# Patient Record
Sex: Female | Born: 1995 | Race: White | Hispanic: No | Marital: Single | Smoking: Never smoker
Health system: Southern US, Community
[De-identification: ages and names within clinical notes are randomized; demographics above are authoritative.]

## PROBLEM LIST (undated history)

## (undated) DIAGNOSIS — Z789 Other specified health status: Secondary | ICD-10-CM

## (undated) HISTORY — PX: NO PAST SURGERIES: SHX2092

---

## 2019-06-15 NOTE — L&D Delivery Note (Signed)
Delivery Note Labor onset: 04/08/2020  Labor Onset Time: 2240 Complete dilation at 5:02 AM  Onset of pushing at 0503 FHR second stage Cat 1 Analgesia/Anesthesia intrapartum: Epidural  Guided pushing with maternal urge. Delivery of a viable female at 21. Fetal head delivered in direct OA position. Nuchal cord x1, loose delivered via somersault manuever. Infant placed on maternal abd, dried, and tactile stim.  Cord double clamped after pulsation stopped and cut by Tina Davila. Tina Davila, FOB and FOB's mother present for birth. Cord blood sample collected yes Arterial cord blood sample N/A.  Placenta delivered Tina Davila, intact, with 3 VC.  Placenta to L&D. Uterine tone firm bleeding scant  1st degree laceration identified.  Anesthesia: Epidural Repair: 2-0 Vicryl CT EBL (mL): 100 Complications: none APGAR: APGAR (1 MIN): 8   APGAR (5 MINS): 9   APGAR (10 MINS):   Mom to postpartum.  Baby to Couplet care / Skin to Skin   Tina Davila Parents request in-pt circ  Roma Schanz MSN, CNM 04/09/2020, 6:15 AM

## 2019-10-24 ENCOUNTER — Other Ambulatory Visit: Payer: Self-pay | Admitting: Obstetrics and Gynecology

## 2019-10-24 DIAGNOSIS — Z363 Encounter for antenatal screening for malformations: Secondary | ICD-10-CM

## 2019-10-24 DIAGNOSIS — Z3A19 19 weeks gestation of pregnancy: Secondary | ICD-10-CM

## 2019-11-13 ENCOUNTER — Encounter: Payer: Self-pay | Admitting: *Deleted

## 2019-11-14 ENCOUNTER — Other Ambulatory Visit: Payer: Self-pay

## 2019-11-14 ENCOUNTER — Ambulatory Visit: Payer: Medicaid Other | Attending: Obstetrics and Gynecology

## 2019-11-14 DIAGNOSIS — Z363 Encounter for antenatal screening for malformations: Secondary | ICD-10-CM | POA: Diagnosis not present

## 2019-11-14 DIAGNOSIS — Z3A19 19 weeks gestation of pregnancy: Secondary | ICD-10-CM | POA: Diagnosis present

## 2019-11-14 DIAGNOSIS — O0932 Supervision of pregnancy with insufficient antenatal care, second trimester: Secondary | ICD-10-CM

## 2020-03-15 LAB — OB RESULTS CONSOLE GBS: GBS: NEGATIVE

## 2020-04-09 ENCOUNTER — Inpatient Hospital Stay (HOSPITAL_COMMUNITY): Payer: Medicaid Other | Admitting: Anesthesiology

## 2020-04-09 ENCOUNTER — Inpatient Hospital Stay (HOSPITAL_COMMUNITY)
Admission: AD | Admit: 2020-04-09 | Discharge: 2020-04-10 | DRG: 807 | Disposition: A | Discharge status: Home / Self Care | Payer: Medicaid Other | Attending: Obstetrics & Gynecology | Admitting: Obstetrics & Gynecology

## 2020-04-09 ENCOUNTER — Encounter (HOSPITAL_COMMUNITY): Payer: Self-pay | Admitting: Obstetrics & Gynecology

## 2020-04-09 ENCOUNTER — Other Ambulatory Visit: Payer: Self-pay

## 2020-04-09 DIAGNOSIS — Z20822 Contact with and (suspected) exposure to covid-19: Secondary | ICD-10-CM | POA: Diagnosis present

## 2020-04-09 DIAGNOSIS — Z283 Underimmunization status: Secondary | ICD-10-CM

## 2020-04-09 DIAGNOSIS — Z3A40 40 weeks gestation of pregnancy: Secondary | ICD-10-CM

## 2020-04-09 DIAGNOSIS — O09899 Supervision of other high risk pregnancies, unspecified trimester: Secondary | ICD-10-CM

## 2020-04-09 DIAGNOSIS — O99891 Other specified diseases and conditions complicating pregnancy: Secondary | ICD-10-CM

## 2020-04-09 DIAGNOSIS — F419 Anxiety disorder, unspecified: Secondary | ICD-10-CM | POA: Diagnosis not present

## 2020-04-09 DIAGNOSIS — O26893 Other specified pregnancy related conditions, third trimester: Secondary | ICD-10-CM | POA: Diagnosis present

## 2020-04-09 HISTORY — DX: Other specified health status: Z78.9

## 2020-04-09 LAB — TYPE AND SCREEN
ABO/RH(D): B POS
Antibody Screen: NEGATIVE

## 2020-04-09 LAB — RESPIRATORY PANEL BY RT PCR (FLU A&B, COVID)
Influenza A by PCR: NEGATIVE
Influenza B by PCR: NEGATIVE
SARS Coronavirus 2 by RT PCR: NEGATIVE

## 2020-04-09 LAB — CBC
HCT: 40.3 % (ref 36.0–46.0)
Hemoglobin: 12.9 g/dL (ref 12.0–15.0)
MCH: 25.5 pg — ABNORMAL LOW (ref 26.0–34.0)
MCHC: 32 g/dL (ref 30.0–36.0)
MCV: 79.6 fL — ABNORMAL LOW (ref 80.0–100.0)
Platelets: 315 10*3/uL (ref 150–400)
RBC: 5.06 MIL/uL (ref 3.87–5.11)
RDW: 18.6 % — ABNORMAL HIGH (ref 11.5–15.5)
WBC: 14.5 10*3/uL — ABNORMAL HIGH (ref 4.0–10.5)
nRBC: 0 % (ref 0.0–0.2)

## 2020-04-09 LAB — RPR: RPR Ser Ql: NONREACTIVE

## 2020-04-09 MED ORDER — LACTATED RINGERS IV SOLN
500.0000 mL | Freq: Once | INTRAVENOUS | Status: AC
  Administered 2020-04-09: 500 mL via INTRAVENOUS

## 2020-04-09 MED ORDER — ONDANSETRON HCL 4 MG/2ML IJ SOLN: 4.0000 mg | Freq: Four times a day (QID) | INTRAMUSCULAR | Status: DC | PRN

## 2020-04-09 MED ORDER — OXYTOCIN-SODIUM CHLORIDE 30-0.9 UT/500ML-% IV SOLN
2.5000 [IU]/h | INTRAVENOUS | Status: DC
  Filled 2020-04-09: qty 500

## 2020-04-09 MED ORDER — SODIUM CHLORIDE (PF) 0.9 % IJ SOLN
INTRAMUSCULAR | Status: DC | PRN
  Administered 2020-04-09: 12 mL/h via EPIDURAL

## 2020-04-09 MED ORDER — WITCH HAZEL-GLYCERIN EX PADS: 1.0000 "application " | MEDICATED_PAD | CUTANEOUS | Status: DC | PRN

## 2020-04-09 MED ORDER — EPHEDRINE 5 MG/ML INJ: 10.0000 mg | INTRAVENOUS | Status: DC | PRN

## 2020-04-09 MED ORDER — LIDOCAINE HCL (PF) 1 % IJ SOLN
INTRAMUSCULAR | Status: DC | PRN
  Administered 2020-04-09: 6 mL via EPIDURAL

## 2020-04-09 MED ORDER — SOD CITRATE-CITRIC ACID 500-334 MG/5ML PO SOLN: 30.0000 mL | ORAL | Status: DC | PRN

## 2020-04-09 MED ORDER — OXYTOCIN BOLUS FROM INFUSION
333.0000 mL | Freq: Once | INTRAVENOUS | Status: AC
  Administered 2020-04-09: 333 mL via INTRAVENOUS

## 2020-04-09 MED ORDER — LACTATED RINGERS IV SOLN: INTRAVENOUS | Status: DC

## 2020-04-09 MED ORDER — LACTATED RINGERS IV SOLN: 500.0000 mL | INTRAVENOUS | Status: DC | PRN

## 2020-04-09 MED ORDER — DIPHENHYDRAMINE HCL 50 MG/ML IJ SOLN: 12.5000 mg | INTRAMUSCULAR | Status: DC | PRN

## 2020-04-09 MED ORDER — BENZOCAINE-MENTHOL 20-0.5 % EX AERO
1.0000 "application " | INHALATION_SPRAY | CUTANEOUS | Status: DC | PRN
  Administered 2020-04-09: 1 via TOPICAL
  Filled 2020-04-09: qty 56

## 2020-04-09 MED ORDER — ONDANSETRON HCL 4 MG/2ML IJ SOLN: 4.0000 mg | INTRAMUSCULAR | Status: DC | PRN

## 2020-04-09 MED ORDER — LIDOCAINE HCL (PF) 1 % IJ SOLN: 30.0000 mL | INTRAMUSCULAR | Status: DC | PRN

## 2020-04-09 MED ORDER — PHENYLEPHRINE 40 MCG/ML (10ML) SYRINGE FOR IV PUSH (FOR BLOOD PRESSURE SUPPORT): 80.0000 ug | PREFILLED_SYRINGE | INTRAVENOUS | Status: DC | PRN

## 2020-04-09 MED ORDER — DIPHENHYDRAMINE HCL 25 MG PO CAPS: 25.0000 mg | ORAL_CAPSULE | Freq: Four times a day (QID) | ORAL | Status: DC | PRN

## 2020-04-09 MED ORDER — ONDANSETRON HCL 4 MG PO TABS: 4.0000 mg | ORAL_TABLET | ORAL | Status: DC | PRN

## 2020-04-09 MED ORDER — DIBUCAINE (PERIANAL) 1 % EX OINT: 1.0000 "application " | TOPICAL_OINTMENT | CUTANEOUS | Status: DC | PRN

## 2020-04-09 MED ORDER — PRENATAL MULTIVITAMIN CH
1.0000 | ORAL_TABLET | Freq: Every day | ORAL | Status: DC
  Administered 2020-04-09 – 2020-04-10 (×2): 1 via ORAL
  Filled 2020-04-09 (×2): qty 1

## 2020-04-09 MED ORDER — SENNOSIDES-DOCUSATE SODIUM 8.6-50 MG PO TABS
2.0000 | ORAL_TABLET | ORAL | Status: DC
  Administered 2020-04-09: 2 via ORAL
  Filled 2020-04-09: qty 2

## 2020-04-09 MED ORDER — FENTANYL-BUPIVACAINE-NACL 0.5-0.125-0.9 MG/250ML-% EP SOLN: 12.0000 mL/h | EPIDURAL | Status: DC | PRN

## 2020-04-09 MED ORDER — TETANUS-DIPHTH-ACELL PERTUSSIS 5-2.5-18.5 LF-MCG/0.5 IM SUSY: 0.5000 mL | PREFILLED_SYRINGE | Freq: Once | INTRAMUSCULAR | Status: DC

## 2020-04-09 MED ORDER — FLEET ENEMA 7-19 GM/118ML RE ENEM: 1.0000 | ENEMA | RECTAL | Status: DC | PRN

## 2020-04-09 MED ORDER — ACETAMINOPHEN 325 MG PO TABS: 650.0000 mg | ORAL_TABLET | ORAL | Status: DC | PRN

## 2020-04-09 MED ORDER — COCONUT OIL OIL
1.0000 "application " | TOPICAL_OIL | Status: DC | PRN
  Administered 2020-04-10: 1 via TOPICAL

## 2020-04-09 MED ORDER — OXYCODONE-ACETAMINOPHEN 5-325 MG PO TABS: 2.0000 | ORAL_TABLET | ORAL | Status: DC | PRN

## 2020-04-09 MED ORDER — IBUPROFEN 600 MG PO TABS
600.0000 mg | ORAL_TABLET | Freq: Four times a day (QID) | ORAL | Status: DC
  Administered 2020-04-09 – 2020-04-10 (×4): 600 mg via ORAL
  Filled 2020-04-09 (×4): qty 1

## 2020-04-09 MED ORDER — OXYCODONE-ACETAMINOPHEN 5-325 MG PO TABS: 1.0000 | ORAL_TABLET | ORAL | Status: DC | PRN

## 2020-04-09 MED ORDER — FENTANYL-BUPIVACAINE-NACL 0.5-0.125-0.9 MG/250ML-% EP SOLN
EPIDURAL | Status: AC
  Filled 2020-04-09: qty 250

## 2020-04-09 MED ORDER — SIMETHICONE 80 MG PO CHEW: 80.0000 mg | CHEWABLE_TABLET | ORAL | Status: DC | PRN

## 2020-04-09 NOTE — Anesthesia Preprocedure Evaluation (Signed)
Anesthesia Evaluation  Patient identified by MRN, date of birth, ID band Patient awake    Reviewed: Allergy & Precautions, H&P , NPO status , Patient's Chart, lab work & pertinent test results, reviewed documented beta blocker date and time   Airway Mallampati: I  TM Distance: >3 FB Neck ROM: full    Dental no notable dental hx. (+) Teeth Intact, Dental Advisory Given   Pulmonary neg pulmonary ROS,    Pulmonary exam normal breath sounds clear to auscultation       Cardiovascular negative cardio ROS Normal cardiovascular exam Rhythm:regular Rate:Normal     Neuro/Psych PSYCHIATRIC DISORDERS Anxiety negative neurological ROS     GI/Hepatic negative GI ROS, Neg liver ROS,   Endo/Other  negative endocrine ROS  Renal/GU negative Renal ROS  negative genitourinary   Musculoskeletal   Abdominal   Peds  Hematology negative hematology ROS (+)   Anesthesia Other Findings   Reproductive/Obstetrics (+) Pregnancy                             Anesthesia Physical Anesthesia Plan  ASA: II  Anesthesia Plan: Epidural   Post-op Pain Management:    Induction:   PONV Risk Score and Plan:   Airway Management Planned: Natural Airway  Additional Equipment:   Intra-op Plan:   Post-operative Plan:   Informed Consent: I have reviewed the patients History and Physical, chart, labs and discussed the procedure including the risks, benefits and alternatives for the proposed anesthesia with the patient or authorized representative who has indicated his/her understanding and acceptance.       Plan Discussed with: Anesthesiologist  Anesthesia Plan Comments:         Anesthesia Quick Evaluation

## 2020-04-09 NOTE — Anesthesia Postprocedure Evaluation (Signed)
Anesthesia Post Note  Patient: Diplomatic Services operational officer  Procedure(s) Performed: AN AD HOC LABOR EPIDURAL     Patient location during evaluation: Mother Baby Anesthesia Type: Epidural Level of consciousness: awake, awake and alert and oriented Pain management: pain level controlled Vital Signs Assessment: post-procedure vital signs reviewed and stable Respiratory status: spontaneous breathing and respiratory function stable Cardiovascular status: blood pressure returned to baseline Postop Assessment: no headache, epidural receding, patient able to bend at knees, adequate PO intake, no backache, no apparent nausea or vomiting and able to ambulate Anesthetic complications: no   No complications documented.  Last Vitals:  Vitals:   04/09/20 0917 04/09/20 1145  BP: 114/75 108/67  Pulse: 71 80  Resp: 17 18  Temp: 37.1 C 36.9 C  SpO2: 97% 100%    Last Pain:  Vitals:   04/09/20 1312  TempSrc:   PainSc: 4    Pain Goal: Patients Stated Pain Goal: 3 (04/09/20 0807)                 Cleda Clarks

## 2020-04-09 NOTE — Anesthesia Procedure Notes (Signed)
Epidural Patient location during procedure: OB Start time: 04/09/2020 3:05 AM End time: 04/09/2020 6:10 AM  Staffing Anesthesiologist: Bethena Midget, MD  Preanesthetic Checklist Completed: patient identified, IV checked, site marked, risks and benefits discussed, surgical consent, monitors and equipment checked, pre-op evaluation and timeout performed  Epidural Patient position: sitting Prep: DuraPrep and site prepped and draped Patient monitoring: continuous pulse ox and blood pressure Approach: midline Location: L3-L4 Injection technique: LOR air  Needle:  Needle type: Tuohy  Needle gauge: 17 G Needle length: 9 cm and 9 Needle insertion depth: 4 cm Catheter type: closed end flexible Catheter size: 19 Gauge Catheter at skin depth: 9 cm Test dose: negative  Assessment Events: blood not aspirated, injection not painful, no injection resistance, no paresthesia and negative IV test

## 2020-04-09 NOTE — H&P (Signed)
OB ADMISSION/ HISTORY & PHYSICAL:  Admission Date: 04/09/2020 12:55 AM  Admit Diagnosis: Normal  Tina Davila is a 24 y.o. female G1P0 [redacted]w[redacted]d presenting for ctx. Endorses active FM, denies LOF. Ctx began in late afternoon and increased in intensity. Scant bloody show w/ wiping.   History of current pregnancy: G1P0   Patient entered care with [facility] at 16+1 wks.   EDC 04/09/20 by LMP and congruent w/ 16+1 wk U/S.   Anatomy scan:  22 wks, complete w/ anterior placenta.   Antenatal testing: None Last evaluation: 33 wks AFI=10.3 EFW=2138g 4 # 11oz, 42%  Significant prenatal events:  Patient Active Problem List   Diagnosis Date Noted  . Normal labor 04/09/2020  . Rubella non-immune status, antepartum 04/09/2020  . Anxiety 04/09/2020    Prenatal Labs: ABO, Rh: --/--/B POS (10/27 0210) Antibody: NEG (10/27 0210) Rubella:   non-immune RPR:   NR HBsAg:   NR HIV:   NR GTT: passed 1 hr GBS: Negative/-- (10/02 0000)  GC/CHL: neg/neg Genetics: low-risk female, Horizon and AFP neg Tdap/influenza vaccines: rec'vd both prenatally   OB History  Gravida Para Term Preterm AB Living  1            SAB TAB Ectopic Multiple Live Births               # Outcome Date GA Lbr Len/2nd Weight Sex Delivery Anes PTL Lv  1 Current             Medical / Surgical History: Past medical history:  Past Medical History:  Diagnosis Date  . Medical history non-contributory     Past surgical history:  Past Surgical History:  Procedure Laterality Date  . NO PAST SURGERIES     Family History: History reviewed. No pertinent family history.  Social History:  reports that she has never smoked. She has never used smokeless tobacco. She reports that she does not drink alcohol and does not use drugs.  Allergies: Patient has no known allergies.   Current Medications at time of admission:  Prior to Admission medications   Medication Sig Start Date End Date Taking? Authorizing Provider  Prenatal Vit-Fe  Fumarate-FA (PRENATAL MULTIVITAMIN) TABS tablet Take 1 tablet by mouth daily at 12 noon.   Yes [provider]    Review of Systems: Constitutional: Negative   HENT: Negative   Eyes: Negative   Respiratory: Negative   Cardiovascular: Negative   Gastrointestinal: Negative  Genitourinary: pos for bloody show, neg for LOF   Musculoskeletal: Negative   Skin: Negative   Neurological: Negative   Endo/Heme/Allergies: Negative   Psychiatric/Behavioral: Negative    Physical Exam: VS: Blood pressure 138/90, pulse (!) 104, temperature 98 F (36.7 C), temperature source Oral, resp. rate 18, height 4\' 10"  (1.473 m), weight 57.6 kg, last menstrual period 07/04/2019, SpO2 99 %. AAO x3, no signs of distress Cardiovascular: RRR Respiratory: Lung fields clear to ausculation GU/GI: Abdomen gravid, non-tender, non-distended, active FM, vertex, EFW 6# 5oz per Leopold's Extremities: no edema, negative for pain, tenderness, and cords  Cervical exam:Dilation: 7.5 Effacement (%): 90 Station: 0 Exam by:: [email or web address] RN FHR: baseline rate 155 / variability moderate / accelerations present / absent decelerations TOCO: 3-5   Prenatal Transfer Tool  Maternal Diabetes: No Genetic Screening: Normal Maternal Ultrasounds/Referrals: Normal Fetal Ultrasounds or other Referrals:  None Maternal Substance Abuse:  No Significant Maternal Medications:  None Significant Maternal Lab Results: Group B Strep negative    Assessment: 24 y.o. G1P0 [redacted]w[redacted]d  Active stage of labor FHR category 1 GBS GBS neg Pain management plan: epidural   Plan:  Admit to L&D Routine admission orders Epidural PRN  Dr Mora Appl notified of admission and plan of care  Roma Schanz MSN, CNM 04/09/2020 3:00 AM

## 2020-04-09 NOTE — Progress Notes (Signed)
Subjective:    Comfortable w/ epidural. Agrees to AROM.   Objective:    VS: BP 109/68   Pulse (!) 105   Temp 98 F (36.7 C) (Oral)   Resp 20   Ht 4\' 10"  (1.473 m)   Wt 57.6 kg   LMP 07/04/2019   SpO2 99%   BMI 26.54 kg/m  FHR : baseline 155 / variability moderate / accelerations present / absent decelerations Toco: contractions every 2 minutes  Membranes: AROM, clear Dilation: 8 Effacement (%): 90 Cervical Position: Middle Station: 0 Presentation: Vertex Exam by:: [email or web address], CNM    Assessment/Plan:   24 y.o. G1P0 [redacted]w[redacted]d  Labor: Progressing normally Preeclampsia:  N/A Fetal Wellbeing:  Category I Pain Control:  Epidural I/D:  n/a Anticipated MOD:  NSVD  [redacted]w[redacted]d MSN, CNM 04/09/2020 3:47 AM

## 2020-04-09 NOTE — Lactation Note (Signed)
This note was copied from a baby's chart. Lactation Consultation Note  Patient Name: Tina Davila OMVEH'M Date: 04/09/2020 Reason for consult: Initial assessment;Term;Primapara;1st time breastfeeding   P1 mother whose infant is now 73 hours old.  This is a term baby at 40+0 weeks.  Mother is on the first floor due to no availability of 4th or 5th floors after delivery.  She and baby have no medical concerns.  Baby was swaddled and asleep in grandmother's arms when I arrived.  Mother had breast fed an hour prior to my arrival.  Reviewed feeding cues and hand expression with mother.  She was able to express colostrum drops from both breasts.  Container provided and milk storage times reviewed.  Finger feeding demonstrated and baby received colostrum drops.  Mother will continue to feed on cue or at least 8-12 times/24 hours.  She will practice hand expression and feed back any EBM she obtains to baby.  Suggested mother call her RN/LC for latch assistance as needed.  Advised to use EBM to rub into nipples/areolas for comfort.  Mother has a DEBP for home use.  Father and grandmother supportive.   Maternal Data Formula Feeding for Exclusion: No Has patient been taught Hand Expression?: Yes Does the patient have breastfeeding experience prior to this delivery?: No  Feeding Feeding Type: Breast Fed  LATCH Score Latch: Grasps breast easily, tongue down, lips flanged, rhythmical sucking.  Audible Swallowing: A few with stimulation  Type of Nipple: Everted at rest and after stimulation  Comfort (Breast/Nipple): Soft / non-tender  Hold (Positioning): Assistance needed to correctly position infant at breast and maintain latch.  LATCH Score: 8  Interventions    Lactation Tools Discussed/Used     Consult Status Consult Status: Follow-up Date: 04/10/20 Follow-up type: In-patient    Paiton Boultinghouse R Jantzen Pilger 04/09/2020, 10:21 AM

## 2020-04-09 NOTE — MAU Note (Signed)
Pt reports stronger, some bloody discharge. SVE 3 in office today. Reports good fetal movement

## 2020-04-10 LAB — CBC
HCT: 35.8 % — ABNORMAL LOW (ref 36.0–46.0)
Hemoglobin: 11.2 g/dL — ABNORMAL LOW (ref 12.0–15.0)
MCH: 25.5 pg — ABNORMAL LOW (ref 26.0–34.0)
MCHC: 31.3 g/dL (ref 30.0–36.0)
MCV: 81.4 fL (ref 80.0–100.0)
Platelets: 245 10*3/uL (ref 150–400)
RBC: 4.4 MIL/uL (ref 3.87–5.11)
RDW: 19.1 % — ABNORMAL HIGH (ref 11.5–15.5)
WBC: 14.6 10*3/uL — ABNORMAL HIGH (ref 4.0–10.5)
nRBC: 0 % (ref 0.0–0.2)

## 2020-04-10 MED ORDER — IBUPROFEN 600 MG PO TABS
600.0000 mg | ORAL_TABLET | Freq: Four times a day (QID) | ORAL | 0 refills | Status: DC
Start: 2020-04-10 — End: 2021-05-20

## 2020-04-10 NOTE — Discharge Summary (Signed)
SVD OB Discharge Summary     Patient Name: Tina Davila DOB: [birth year] MRN: [id number]  Date of admission: 04/09/2020 Delivering MD: Rhea Pink B  Date of delivery: 04/09/2020 Type of delivery: SVD  Newborn Data: Sex: Baby female Circumcision: DR Normand Sloop performed today.  Live born female  Birth Weight: 7 lb 2.5 oz (3246 g) APGAR: 8, 9  Newborn Delivery   Birth date/time: 04/09/2020 05:44:00 Delivery type: Vaginal, Spontaneous      Feeding: breast and bottle Infant being discharge to home with mother in stable condition.   Admitting diagnosis: Normal labor [O80, Z37.9] Intrauterine pregnancy: [redacted]w[redacted]d     Secondary diagnosis:  Active Problems:   Normal labor   Rubella non-immune status, antepartum   Anxiety   SVD (spontaneous vaginal delivery)   Normal postpartum course                                Complications: None                                                              Intrapartum Procedures: spontaneous vaginal delivery Postpartum Procedures: none Complications-Operative and Postpartum: 1st degree perineal laceration Augmentation: AROM   History of Present Illness: Ms. Tina Davila is a 24 y.o. female, G1P1001, who presents at [redacted]w[redacted]d weeks gestation. The patient has been followed at  [facility] and Gynecology  Her pregnancy has been complicated by:  Patient Active Problem List   Diagnosis Date Noted  . SVD (spontaneous vaginal delivery) 04/10/2020  . Normal postpartum course 04/10/2020  . Normal labor 04/09/2020  . Rubella non-immune status, antepartum 04/09/2020  . Anxiety 04/09/2020    Hospital course:  Onset of Labor With Vaginal Delivery      24 y.o. yo G1P1001 at [redacted]w[redacted]d was admitted in Active Labor on 04/09/2020. Patient had an uncomplicated labor course as follows:  Membrane Rupture Time/Date: 3:41 AM ,04/09/2020   Delivery Method:Vaginal, Spontaneous  Episiotomy: None  Lacerations:  1st degree  Patient had an  uncomplicated postpartum course.  She is ambulating, tolerating a regular diet, passing flatus, and urinating well. Patient is discharged home in stable condition on 04/10/20.  Newborn Data: Birth date:04/09/2020  Birth time:5:44 AM  Gender:Female  Living status:Living  Apgars:8 ,9  Weight:3246 g  Postpartum Day # 1 : S/P NSVD due to spontaneous active labor, was augmented with AROM, over 1st degree laceration, with ebl of , hgb drop from 12.9-11.2, stable. H/O anxiety, no meds, mood stable, denies si/hi, will report mood change to ccob with 2 week PP mood check, pt reviewed s/sx of PPD and need for medication, pt declined meds for now. Patient up ad lib, denies syncope or dizziness. Reports consuming regular diet without issues and denies N/V. Patient reports 0 bowel movement + passing flatus.  Denies issues with urination and reports bleeding is "lighter."  Patient is breast and bottlefeeding and reports going well.  Desires undecided/comdoms for postpartum contraception.  Pain is being appropriately managed with use of po meds.    Physical exam  Vitals:   04/09/20 1955 04/09/20 2344 04/10/20 0426 04/10/20 0849  BP: (!) 128/95 117/68 109/74 120/75  Pulse: 82 61 80 87  Resp: 18 19 18  18  Temp: 98.2 F (36.8 C) 97.8 F (36.6 C) 98.2 F (36.8 C) (!) 97.5 F (36.4 C)  TempSrc: Oral Oral Oral Oral  SpO2: 99% 100% 100%   Weight:      Height:       General: alert, cooperative and no distress Lochia: appropriate Uterine Fundus: firm Perineum: approximate, no hematomas noted DVT Evaluation: No evidence of DVT seen on physical exam. Negative Homan's sign. No cords or calf tenderness. No significant calf/ankle edema.  Labs: Lab Results  Component Value Date   WBC 14.6 (H) 04/10/2020   HGB 11.2 (L) 04/10/2020   HCT 35.8 (L) 04/10/2020   MCV 81.4 04/10/2020   PLT 245 04/10/2020   No flowsheet data found.  Date of discharge: 04/10/2020 Discharge Diagnoses: Term  Pregnancy-delivered Discharge instruction: per After Visit Summary and "Baby and Me Booklet".  After visit meds:   Activity:           unrestricted and pelvic rest Advance as tolerated. Pelvic rest for 6 weeks.  Diet:                routine Medications: PNV and Ibuprofen Postpartum contraception: Condoms and Undecided Condition:  Pt discharge to home with baby in stable and  condition H/O Anxiety: NO meds, 2 week Mood check up at [facility], report mood changes, SI/HI.   Meds: Allergies as of 04/10/2020   No Known Allergies     Medication List    TAKE these medications   ibuprofen 600 MG tablet Commonly known as: ADVIL Take 1 tablet (600 mg total) by mouth every 6 (six) hours.   prenatal multivitamin Tabs tablet Take 1 tablet by mouth daily at 12 noon.       Discharge Follow Up:   Follow-up Information    [facility]. Schedule an appointment as soon as possible for a visit in 2 week(s).   Specialty: Obstetrics and Gynecology Why:  weeks Mood check and 6 weeks PPV Contact information: [address] [place] [address] [phone]               [place], NP-C, CNM 04/10/2020, 10:07 AM  Tina Windsor, FNP

## 2020-04-10 NOTE — Progress Notes (Signed)
CSW received consult for hx of Anxiety.  CSW met with MOB to offer support and complete assessment.    CSW met with MOB at bedside to discuss consult for history of anxiety. MOB was welcoming, pleasant and remained engaged during assessment. CSW and MOB discussed MOB's mental health history. MOB reported that she was diagnosed with anxiety 5 years ago during high school. MOB reported that her anxiety was situational and denied any current symptoms. MOB denied any additional mental health history. CSW inquired about how MOB was feeling emotionally after giving birth, MOB reported that she was feeling great. CSW inquired about MOB's support system, MOB reported that FOB and her mom are supports. MOB shared that her mother resides in [place] and will be visiting next week. MOB reported that they have all items needed to care for infant including car seat and basinet. MOB presented calm and did not demonstrate any acute mental health signs/symptoms. CSW assessed for safety, MOB denied SI, HI and domestic violence.   CSW provided education regarding the baby blues period vs. perinatal mood disorders, discussed treatment and gave resources for mental health follow up if concerns arise.  CSW recommends self-evaluation during the postpartum time period using the New Mom Checklist from Postpartum Progress and encouraged MOB to contact a medical professional if symptoms are noted at any time.    CSW provided review of Sudden Infant Death Syndrome (SIDS) precautions.    CSW identifies no further need for intervention and no barriers to discharge at this time.  Abundio Miu, [place] Worker [facility] Cell#: [phone]

## 2020-12-04 ENCOUNTER — Other Ambulatory Visit: Payer: Self-pay

## 2020-12-04 ENCOUNTER — Emergency Department (HOSPITAL_BASED_OUTPATIENT_CLINIC_OR_DEPARTMENT_OTHER): Payer: Medicaid Other

## 2020-12-04 ENCOUNTER — Encounter (HOSPITAL_BASED_OUTPATIENT_CLINIC_OR_DEPARTMENT_OTHER): Payer: Self-pay

## 2020-12-04 ENCOUNTER — Emergency Department (HOSPITAL_BASED_OUTPATIENT_CLINIC_OR_DEPARTMENT_OTHER)
Admission: EM | Admit: 2020-12-04 | Discharge: 2020-12-04 | Disposition: A | Discharge status: Home / Self Care | Payer: Medicaid Other | Attending: Emergency Medicine | Admitting: Emergency Medicine

## 2020-12-04 DIAGNOSIS — R0789 Other chest pain: Secondary | ICD-10-CM | POA: Diagnosis not present

## 2020-12-04 MED ORDER — HYOSCYAMINE SULFATE 0.125 MG SL SUBL
0.2500 mg | SUBLINGUAL_TABLET | Freq: Once | SUBLINGUAL | Status: AC
  Administered 2020-12-04: 0.25 mg via SUBLINGUAL
  Filled 2020-12-04: qty 2

## 2020-12-04 MED ORDER — ALUM & MAG HYDROXIDE-SIMETH 200-200-20 MG/5ML PO SUSP
30.0000 mL | Freq: Once | ORAL | Status: AC
  Administered 2020-12-04: 30 mL via ORAL
  Filled 2020-12-04: qty 30

## 2020-12-04 MED ORDER — LIDOCAINE VISCOUS HCL 2 % MT SOLN
15.0000 mL | Freq: Once | OROMUCOSAL | Status: AC
  Administered 2020-12-04: 15 mL via ORAL
  Filled 2020-12-04: qty 15

## 2020-12-04 NOTE — ED Provider Notes (Signed)
[facility] EMERGENCY DEPT Provider Note  CSN: [id number] Arrival date & time: 12/04/20 0023  Chief Complaint(s) Chest Pain  HPI Tina Davila is a 25 y.o. female    Chest Pain Pain location:  Substernal area Pain quality comment:  Squeezing Pain radiates to:  Does not radiate Pain severity:  Moderate Duration:  1 week Timing:  Intermittent Progression:  Waxing and waning Chronicity:  New Relieved by:  Nothing Worsened by:  Nothing Associated symptoms: no back pain, no cough, no lower extremity edema, no nausea and no vomiting    Past Medical History Past Medical History:  Diagnosis Date   Medical history non-contributory    Patient Active Problem List   Diagnosis Date Noted   SVD (spontaneous vaginal delivery) 04/10/2020   Normal postpartum course 04/10/2020   Normal labor 04/09/2020   Rubella non-immune status, antepartum 04/09/2020   Anxiety 04/09/2020   Home Medication(s) Prior to Admission medications   Medication Sig Start Date End Date Taking? Authorizing Provider  ibuprofen (ADVIL) 600 MG tablet Take 1 tablet (600 mg total) by mouth every 6 (six) hours. 04/10/20   Dale [place], FNP  Prenatal Vit-Fe Fumarate-FA (PRENATAL MULTIVITAMIN) TABS tablet Take 1 tablet by mouth daily at 12 noon.    [provider]                                                                                                                                    Past Surgical History Past Surgical History:  Procedure Laterality Date   NO PAST SURGERIES     Family History History reviewed. No pertinent family history.  Social History Social History   Tobacco Use   Smoking status: Never   Smokeless tobacco: Never  Vaping Use   Vaping Use: Some days  Substance Use Topics   Alcohol use: Never   Drug use: Never   Allergies Patient has no known allergies.  Review of Systems Review of Systems  Respiratory:  Negative for cough.   Cardiovascular:   Positive for chest pain.  Gastrointestinal:  Negative for nausea and vomiting.  Musculoskeletal:  Negative for back pain.  All other systems are reviewed and are negative for acute change except as noted in the HPI  Physical Exam Vital Signs  I have reviewed the triage vital signs BP 115/74 (BP Location: Right Arm)   Pulse 88   Temp 98.2 F (36.8 C) (Oral)   Resp 18   Ht 4\' 10"  (1.473 m)   Wt 58.1 kg   LMP 11/13/2020   SpO2 100%   BMI 26.75 kg/m   Physical Exam Vitals reviewed.  Constitutional:      General: She is not in acute distress.    Appearance: She is well-developed. She is not diaphoretic.  HENT:     Head: Normocephalic and atraumatic.     Nose: Nose normal.  Eyes:     General: No scleral icterus.  Right eye: No discharge.        Left eye: No discharge.     Conjunctiva/sclera: Conjunctivae normal.     Pupils: Pupils are equal, round, and reactive to light.  Cardiovascular:     Rate and Rhythm: Normal rate and regular rhythm.     Heart sounds: No murmur heard.   No friction rub. No gallop.  Pulmonary:     Effort: Pulmonary effort is normal. No respiratory distress.     Breath sounds: Normal breath sounds. No stridor. No rales.  Abdominal:     General: There is no distension.     Palpations: Abdomen is soft.     Tenderness: There is no abdominal tenderness.  Musculoskeletal:        General: No tenderness.     Cervical back: Normal range of motion and neck supple.  Skin:    General: Skin is warm and dry.     Findings: No erythema or rash.  Neurological:     Mental Status: She is alert and oriented to person, place, and time.    ED Results and Treatments Labs (all labs ordered are listed, but only abnormal results are displayed) Labs Reviewed - No data to display                                                                                                                       EKG  EKG Interpretation  Date/Time:  Thursday December 04 2020  02:02:58 EDT Ventricular Rate:  76 PR Interval:  129 QRS Duration: 106 QT Interval:  366 QTC Calculation: 412 R Axis:   44 Text Interpretation: Sinus rhythm No old tracing to compare Confirmed by Drema Pry [phone]) on 12/04/2020 2:06:58 AM        Radiology DG Chest Port 1 View  Result Date: 12/04/2020 CLINICAL DATA:  Pt reports intermittent chest pain - states it started few days ago and its more frequent today Pt states possibility of pregnancy. Patient's abdomen shielded during chest xr. EXAM: PORTABLE CHEST 1 VIEW COMPARISON:  None. FINDINGS: The heart size and mediastinal contours are within normal limits. No focal consolidation. No pulmonary edema. No pleural effusion. No pneumothorax. No acute osseous abnormality. IMPRESSION: No active disease. Electronically Signed   By: Tish Frederickson M.D.   On: 12/04/2020 03:08    Pertinent labs & imaging results that were available during my care of the patient were reviewed by me and considered in my medical decision making (see chart for details).  Medications Ordered in ED Medications  alum & mag hydroxide-simeth (MAALOX/MYLANTA) 200-200-20 MG/5ML suspension 30 mL (30 mLs Oral Given 12/04/20 0228)    And  lidocaine (XYLOCAINE) 2 % viscous mouth solution 15 mL (15 mLs Oral Given 12/04/20 0228)  hyoscyamine (LEVSIN SL) SL tablet 0.25 mg (0.25 mg Sublingual Given 12/04/20 0227)  Procedures Procedures  (including critical care time)  Medical Decision Making / ED Course I have reviewed the nursing notes for this encounter and the patient's prior records (if available in EHR or on provided paperwork).   Tina Davila was evaluated in Emergency Department on 12/04/2020 for the symptoms described in the history of present illness. She was evaluated in the context of the global COVID-19 pandemic, which  necessitated consideration that the patient might be at risk for infection with the SARS-CoV-2 virus that causes COVID-19. Institutional protocols and algorithms that pertain to the evaluation of patients at risk for COVID-19 are in a state of rapid change based on information released by regulatory bodies including the CDC and federal and state organizations. These policies and algorithms were followed during the patient's care in the ED.  Intermittent chest squeezing. Highly consistent with ACS.  EKG without acute ischemic changes, dysrhythmias or evidence of pericarditis. Do not believe that additional cardiac work-up is necessary at this time. Presentation is not classic for aortic dissection or esophageal perforation.. Low suspicion for pulmonary embolism. Chest x-ray without evidence suggestive of pneumonia, pneumothorax, pneumomediastinum.  No abnormal contour of the mediastinum to suggest dissection. No evidence of acute injuries.  Given patient's description of symptoms, I feel a GI etiology is more likely.      Final Clinical Impression(s) / ED Diagnoses Final diagnoses:  Chest discomfort   The patient appears reasonably screened and/or stabilized for discharge and I doubt any other medical condition or other [facility] requiring further screening, evaluation, or treatment in the ED at this time prior to discharge. Safe for discharge with strict return precautions.  Disposition: Discharge  Condition: Good  I have discussed the results, Dx and Tx plan with the patient/family who expressed understanding and agree(s) with the plan. Discharge instructions discussed at length. The patient/family was given strict return precautions who verbalized understanding of the instructions. No further questions at time of discharge.    ED Discharge Orders     None        Follow Up: Primary care provider  Call  to schedule an appointment for close follow up      This chart was dictated  using voice recognition software.  Despite best efforts to proofread,  errors can occur which can change the documentation meaning.    Nira Conn, MD 12/04/20 [phone]

## 2020-12-04 NOTE — ED Triage Notes (Signed)
Pt reports intermittent  chest pain - states it started few days ago  and its more frequent today

## 2021-01-19 LAB — OB RESULTS CONSOLE RUBELLA ANTIBODY, IGM: Rubella: NON-IMMUNE/NOT IMMUNE

## 2021-01-29 LAB — OB RESULTS CONSOLE HEPATITIS B SURFACE ANTIGEN: Hepatitis B Surface Ag: NEGATIVE

## 2021-01-29 LAB — OB RESULTS CONSOLE HIV ANTIBODY (ROUTINE TESTING): HIV: NONREACTIVE

## 2021-02-03 LAB — OB RESULTS CONSOLE GC/CHLAMYDIA
Chlamydia: NEGATIVE
Gonorrhea: NEGATIVE

## 2021-02-27 LAB — OB RESULTS CONSOLE RPR: RPR: NONREACTIVE

## 2021-05-19 ENCOUNTER — Inpatient Hospital Stay (HOSPITAL_COMMUNITY)
Admission: AD | Admit: 2021-05-19 | Discharge: 2021-05-20 | Disposition: A | Discharge status: Home / Self Care | Payer: Medicaid Other | Attending: Obstetrics & Gynecology | Admitting: Obstetrics & Gynecology

## 2021-05-19 ENCOUNTER — Other Ambulatory Visit: Payer: Self-pay

## 2021-05-19 ENCOUNTER — Encounter (HOSPITAL_COMMUNITY): Payer: Self-pay | Admitting: Obstetrics & Gynecology

## 2021-05-19 DIAGNOSIS — Z3A26 26 weeks gestation of pregnancy: Secondary | ICD-10-CM | POA: Insufficient documentation

## 2021-05-19 DIAGNOSIS — W109XXA Fall (on) (from) unspecified stairs and steps, initial encounter: Secondary | ICD-10-CM | POA: Insufficient documentation

## 2021-05-19 DIAGNOSIS — Z3482 Encounter for supervision of other normal pregnancy, second trimester: Secondary | ICD-10-CM | POA: Insufficient documentation

## 2021-05-19 DIAGNOSIS — W19XXXA Unspecified fall, initial encounter: Secondary | ICD-10-CM

## 2021-05-19 NOTE — Progress Notes (Signed)
Baby moving when EFM applied. Pt states baby has been very active

## 2021-05-19 NOTE — MAU Note (Signed)
At 0930 this am I fell down some stairs. I was taking my dog out and slipped and bounced down 5 stairs more on my right side of my R leg. Never hit my stomach. Good FM today and no LOF or VB. No pain. Was just advised by office to come in to have baby checked

## 2021-05-19 NOTE — MAU Provider Note (Signed)
History     CSN: [id number]  Arrival date and time: 05/19/21 2128   Event Date/Time   First Provider Initiated Contact with Patient 05/19/21 2302      Chief Complaint  Patient presents with   Fall   HPI Tina Davila is a 25 y.o. G2P1001 at [redacted]w[redacted]d who presents to MAU for a fall. Patient reports she tripped and fell down some steps around 0930 this morning. She landed on her right hip/leg. She did not hit her abdomen or sustain trauma to her head. She denies pain, contractions, vaginal bleeding or leaking fluid. Endorses active fetal movement.   OB History     Gravida  2   Para  1   Term  1   Preterm      AB      Living  1      SAB      IAB      Ectopic      Multiple  0   Live Births  1           Past Medical History:  Diagnosis Date   Medical history non-contributory     Past Surgical History:  Procedure Laterality Date   NO PAST SURGERIES      History reviewed. No pertinent family history.  Social History   Tobacco Use   Smoking status: Never   Smokeless tobacco: Never  Vaping Use   Vaping Use: Some days  Substance Use Topics   Alcohol use: Never   Drug use: Never    Allergies: No Known Allergies  Medications Prior to Admission  Medication Sig Dispense Refill Last Dose   Prenatal Vit-Fe Fumarate-FA (PRENATAL MULTIVITAMIN) TABS tablet Take 1 tablet by mouth daily at 12 noon.   05/19/2021   ibuprofen (ADVIL) 600 MG tablet Take 1 tablet (600 mg total) by mouth every 6 (six) hours. 30 tablet 0     Review of Systems  Constitutional: Negative.   Respiratory: Negative.    Cardiovascular: Negative.   Gastrointestinal: Negative.   Genitourinary: Negative.   Musculoskeletal: Negative.   Neurological: Negative.    Physical Exam   Blood pressure 113/67, pulse 84, temperature 98 F (36.7 C), resp. rate 17, height 4\' 10"  (1.473 m), weight 56.7 kg, last menstrual period 11/13/2020, SpO2 100 %, unknown if currently  breastfeeding.  Physical Exam Vitals and nursing note reviewed.  Constitutional:      General: She is not in acute distress.    Appearance: She is normal weight.  Eyes:     Pupils: Pupils are equal, round, and reactive to light.  Cardiovascular:     Rate and Rhythm: Normal rate.  Pulmonary:     Effort: Pulmonary effort is normal.  Abdominal:     Palpations: Abdomen is soft.     Tenderness: There is no abdominal tenderness.  Musculoskeletal:        General: Normal range of motion.     Right upper leg: No swelling, deformity or tenderness.     Comments: Small bruise noted on right lateral thigh  Skin:    General: Skin is warm and dry.  Neurological:     General: No focal deficit present.     Mental Status: She is alert and oriented to person, place, and time.  Psychiatric:        Mood and Affect: Mood normal.        Behavior: Behavior normal.        Thought Content: Thought content normal.  Judgment: Judgment normal.   NST: FHR 135bpm, moderate variability, +15x15 accels, no decels Toco: quiet  MAU Course  Procedures  MDM D/w Dr. Jolayne Panther who recommends continuous fetal monitoring for 4 hours NST remains reactive and reassuring for gestational age after continuous monitoring Patient continues to deny pain, bleeding, or leaking fluid and endorses active fetal movement  Assessment and Plan  [redacted] weeks gestation of pregnancy Fall  - Discharge home in stable condition - Strict return precautions reviewed at length with patient. Return to MAU as needed - Keep OB appointment as scheduled next week    Brand Males, CNM 05/20/2021, 2:53 AM

## 2021-05-20 DIAGNOSIS — Z3A26 26 weeks gestation of pregnancy: Secondary | ICD-10-CM

## 2021-05-20 DIAGNOSIS — W19XXXA Unspecified fall, initial encounter: Secondary | ICD-10-CM | POA: Diagnosis not present

## 2021-05-20 DIAGNOSIS — O98212 Gonorrhea complicating pregnancy, second trimester: Secondary | ICD-10-CM | POA: Diagnosis not present

## 2021-05-20 DIAGNOSIS — Z3482 Encounter for supervision of other normal pregnancy, second trimester: Secondary | ICD-10-CM | POA: Diagnosis present

## 2021-05-20 DIAGNOSIS — W109XXA Fall (on) (from) unspecified stairs and steps, initial encounter: Secondary | ICD-10-CM | POA: Diagnosis not present

## 2021-05-20 NOTE — Progress Notes (Signed)
Camelia Eng CNM in earlier to discuss d/c plan with pt. Written and verbal d/c instructions given and understanding voiced.

## 2021-06-14 NOTE — L&D Delivery Note (Signed)
Vaginal Delivery Note ? ?Patient pushed for 5 minutes after she was noted to be C/C/+2. Guided pushing with maternal urge and regular contractions. ?At 2:40 PM a viable and healthy female was delivered via Vaginal, Spontaneous (Presentation:   Occiput Anterior).  APGAR: 8/9; weight pending.   ?After head was delivered loose nuchal cord reduced. Restituted head to LOA and then shoulders and body easily delivered. There was a compound hand.  Baby laid on maternal abdomen, dried and tactile stimulation.  ? ?Baby noted to have a vigorous cry and moving all four extremities.  ?Delayed cord clamping done and cord cut by father. Cord blood obtained.  ? ?Placenta spontaneously delivered intact with trailing membranes,   ?Uterine atony alleviated by IV pitocin and massage.  ?First degree laceration repaired in routine fashion with 3-0 chromic. ?Patient tolerated delivery well, there were no complications.  ? ? ?Delivery Details: ?Delivery Type: NSVD  ?Anesthesia Epidural  ?Episiotomy:  None  ?Lacerations: First degree  ?Repair suture: 3-0 Chromic  ?Blood loss (ml): 50 cc  ? ?Birth information: ?Date of birth:   [birth year]  ?Time of birth:   14:40  ?Sex: Information for the patient's newborn:  Marylyn, Appenzeller [[id number]]  ?female    ?Name:  "Anette Riedel"  ?APGAR APGAR (1 MIN):  8 ?APGAR (5 MINS):  9 ?APGAR (10 MINS):  9  ?Weight  Pending  ? ?Resuscitation:  Drying and stim  ?Cord information:    Blood gases sent? N ?Complications:      None  ?Placenta: Delivered: spontaneously, intact    appearance: Normal 3VC ? ? ?Disposition: ?Mom to postpartum.  Baby to Couplet care / Skin to Skin. ? ?Essie Hart MD ?08/14/2021, 3:08 PM ? ?   ? ?  ? ?

## 2021-07-21 LAB — OB RESULTS CONSOLE GBS: GBS: NEGATIVE

## 2021-08-13 ENCOUNTER — Inpatient Hospital Stay (HOSPITAL_COMMUNITY)
Admission: AD | Admit: 2021-08-13 | Discharge: 2021-08-15 | DRG: 807 | Disposition: A | Discharge status: Home / Self Care | Payer: Medicaid Other | Attending: Obstetrics & Gynecology | Admitting: Obstetrics & Gynecology

## 2021-08-13 ENCOUNTER — Encounter (HOSPITAL_COMMUNITY): Payer: Self-pay | Admitting: Obstetrics & Gynecology

## 2021-08-13 ENCOUNTER — Other Ambulatory Visit: Payer: Self-pay

## 2021-08-13 DIAGNOSIS — Z20822 Contact with and (suspected) exposure to covid-19: Secondary | ICD-10-CM | POA: Diagnosis present

## 2021-08-13 DIAGNOSIS — O4292 Full-term premature rupture of membranes, unspecified as to length of time between rupture and onset of labor: Principal | ICD-10-CM

## 2021-08-13 DIAGNOSIS — Z3A39 39 weeks gestation of pregnancy: Secondary | ICD-10-CM

## 2021-08-13 DIAGNOSIS — Z23 Encounter for immunization: Secondary | ICD-10-CM

## 2021-08-13 LAB — TYPE AND SCREEN
ABO/RH(D): B POS
Antibody Screen: NEGATIVE

## 2021-08-13 LAB — CBC
HCT: 32.2 % — ABNORMAL LOW (ref 36.0–46.0)
Hemoglobin: 10.3 g/dL — ABNORMAL LOW (ref 12.0–15.0)
MCH: 23.7 pg — ABNORMAL LOW (ref 26.0–34.0)
MCHC: 32 g/dL (ref 30.0–36.0)
MCV: 74 fL — ABNORMAL LOW (ref 80.0–100.0)
Platelets: 318 10*3/uL (ref 150–400)
RBC: 4.35 MIL/uL (ref 3.87–5.11)
RDW: 14.2 % (ref 11.5–15.5)
WBC: 12.6 10*3/uL — ABNORMAL HIGH (ref 4.0–10.5)
nRBC: 0 % (ref 0.0–0.2)

## 2021-08-13 LAB — RESP PANEL BY RT-PCR (FLU A&B, COVID) ARPGX2
Influenza A by PCR: NEGATIVE
Influenza B by PCR: NEGATIVE
SARS Coronavirus 2 by RT PCR: NEGATIVE

## 2021-08-13 MED ORDER — LACTATED RINGERS IV SOLN: INTRAVENOUS | Status: DC

## 2021-08-13 NOTE — MAU Note (Addendum)
Leaking clear fld since 1930 and has continued to leak. Having occ, mild contractions. Was 2-3cm on Monday. Good FM (Unable to sign waiver due to computer being broken) ?

## 2021-08-13 NOTE — H&P (Signed)
OB ADMISSION/ HISTORY & PHYSICAL: ? ?Admission Date: 08/13/2021  8:32 PM  ?Admit Diagnosis: SROM ? ?Blakeleigh Domek is a 26 y.o. female G2P1001 [redacted]w[redacted]d presenting for leaking of fluid. Endorses active FM, denies vaginal bleeding and ctxs.  ? ?History of current pregnancy: ?G2P1001   ?Primary OB Provider: [facility] ?Patient entered care with [facility] at 15.1 wks.   ?EDC 08/20/21 by [facility]MP06/02/22 and congruent w/ 15.1 wk U/S.   ?Anatomy scan:  24 wks, complete    ?Antenatal testing: N/A ?Last evaluation: 38.4  wks  ?Significant prenatal events:   ?Patient Active Problem List  ? Diagnosis Date Noted  ? SVD (spontaneous vaginal delivery) 04/10/2020  ? Normal postpartum course 04/10/2020  ? Normal labor 04/09/2020  ? Rubella non-immune status, antepartum 04/09/2020  ? Anxiety 04/09/2020  ? ? ?Prenatal Labs: ?ABO, Rh:  B positive ?Antibody:  Negative ?Rubella:   Non Immune ?RPR:   NR ?HBsAg:   NR ?HIV:   NR ?GTT: 95 ?GBS:   Negative ?GC/CHL: Negative/Negative ?Genetics: low risk ?Tdap/influenza vaccines: UTD ? ? ?OB History  ?Gravida Para Term Preterm AB Living  ?2 1 1     1   ?SAB IAB Ectopic Multiple Live Births  ?      0 1  ?  ?# Outcome Date GA Lbr Len/2nd Weight Sex Delivery Anes PTL Lv  ?2 Current           ?1 Term 04/09/20 [redacted]w[redacted]d 06:22 / 00:42 3246 g M Vag-Spont EPI  LIV  ? ? ?Medical / Surgical History: ?Past medical history:  ?Past Medical History:  ?Diagnosis Date  ? Medical history non-contributory   ?  ?Past surgical history:  ?Past Surgical History:  ?Procedure Laterality Date  ? NO PAST SURGERIES    ? ?Family History: History reviewed. No pertinent family history.  ?Social History:  reports that she has never smoked. She has never used smokeless tobacco. She reports that she does not drink alcohol and does not use drugs. ? ?Allergies: ?Patient has no known allergies. ?  ?Current Medications at time of admission:  ?Prior to Admission medications   ?Medication Sig Start Date End Date Taking? Authorizing Provider   ?Prenatal Vit-Fe Fumarate-FA (PRENATAL MULTIVITAMIN) TABS tablet Take 1 tablet by mouth daily at 12 noon.   Yes [provider]  ? ? ?Review of Systems: ?Constitutional: Negative   ?HENT: Negative   ?Eyes: Negative   ?Respiratory: Negative   ?Cardiovascular: Negative   ?Gastrointestinal: Negative  ?Genitourinary: negative for bloody show, positive for LOF   ?Musculoskeletal: Negative   ?Skin: Negative   ?Neurological: Negative   ?Endo/Heme/Allergies: Negative   ?Psychiatric/Behavioral: Negative  ? ? ?Physical Exam: ?VS: Blood pressure 114/72, pulse 93, temperature 98.1 ?F (36.7 ?C), resp. rate 16, height 4\' 10"  (1.473 m), weight 56.7 kg, last menstrual period 11/13/2020, SpO2 99 %, unknown if currently breastfeeding. ?AAO x3, no signs of distress ?Cardiovascular: RRR ?Respiratory: Lung fields clear to ausculation ?GU/GI: Abdomen gravid, non-tender, non-distended, active FM, vertex per U/S ?Extremities: negative edema, negative for pain, tenderness, and cords ? ?Cervical exam:  ?FHR: baseline rate 140 / variability moderate / accelerations present / absent decelerations ?TOCO: 2-4 ? ? ?Prenatal Transfer Tool  ?Maternal Diabetes: No ?Genetic Screening: Normal ?Maternal Ultrasounds/Referrals: Normal ?Fetal Ultrasounds or other Referrals:  None ?Maternal Substance Abuse:  No ?Significant Maternal Medications:  None ?Significant Maternal Lab Results: Group B Strep negative ? ? ? ?Assessment: ?26 y.o. G2P1001 [redacted]w[redacted]d admitted for SROM ? ?latent stage of labor ?FHR  category 1 ?GBS negative ?Pain management plan: epidural prn ? ? ?Plan:  ?Admit to L&D ?Routine admission orders ?Epidural PRN ?  ?Dr Sallye Ober notified of admission and plan of care ? ?Carollee Leitz MSN, CNM ?08/13/2021 10:13 PM  ?

## 2021-08-13 NOTE — MAU Provider Note (Signed)
None  ?  ? ? ?S: Ms. Riley Papin is a 26 y.o. G2P1001 at [redacted]w[redacted]d  who presents to MAU today complaining of leaking of fluid since 1930. She denies vaginal bleeding. She endorses mild contractions. She reports normal fetal movement.   ? ?O: BP 114/72   Pulse 93   Temp 98.1 ?F (36.7 ?C)   Resp 16   Ht 4\' 10"  (1.473 m)   Wt 56.7 kg   LMP 11/13/2020   SpO2 99%   BMI 26.13 kg/m?  ?GENERAL: Well-developed, well-nourished female in no acute distress.  ?HEAD: Normocephalic, atraumatic.  ?CHEST: Normal effort of breathing, regular heart rate ?ABDOMEN: Soft, nontender, gravid ?PELVIC: Normal external female genitalia. Vagina is pink and rugated. Cervix with normal contour, no lesions. Normal discharge.   ?Positive ferning on slide ? ?Cervical exam:  ?  ? ?Pt informed that the ultrasound is considered a limited OB ultrasound and is not intended to be a complete ultrasound exam.  Patient also informed that the ultrasound is not being completed with the intent of assessing for fetal or placental anomalies or any pelvic abnormalities.  Explained that the purpose of today?s ultrasound is to assess for  presentation.  Patient acknowledges the purpose of the exam and the limitations of the study.    ? ?Vertex position confirmed by 01/13/2021 ? ? ?No results found for this or any previous visit (from the past 24 hour(s)). ? ? ?A: ?[facility] at [redacted]w[redacted]d  ?SROM ? ?P: ?RN to call Dr [redacted]w[redacted]d for admission ? ?Sallye Ober, CNM ?08/13/2021 9:59 PM  ?

## 2021-08-14 ENCOUNTER — Encounter (HOSPITAL_COMMUNITY): Payer: Self-pay | Admitting: Obstetrics & Gynecology

## 2021-08-14 ENCOUNTER — Inpatient Hospital Stay (HOSPITAL_COMMUNITY): Payer: Medicaid Other | Admitting: Anesthesiology

## 2021-08-14 DIAGNOSIS — Z3A39 39 weeks gestation of pregnancy: Secondary | ICD-10-CM | POA: Diagnosis not present

## 2021-08-14 DIAGNOSIS — Z23 Encounter for immunization: Secondary | ICD-10-CM | POA: Diagnosis not present

## 2021-08-14 DIAGNOSIS — Z20822 Contact with and (suspected) exposure to covid-19: Secondary | ICD-10-CM | POA: Diagnosis present

## 2021-08-14 DIAGNOSIS — O26893 Other specified pregnancy related conditions, third trimester: Secondary | ICD-10-CM | POA: Diagnosis present

## 2021-08-14 DIAGNOSIS — O4292 Full-term premature rupture of membranes, unspecified as to length of time between rupture and onset of labor: Secondary | ICD-10-CM | POA: Diagnosis present

## 2021-08-14 LAB — POCT FERN TEST: POCT Fern Test: POSITIVE

## 2021-08-14 LAB — RPR: RPR Ser Ql: NONREACTIVE

## 2021-08-14 MED ORDER — EPHEDRINE 5 MG/ML INJ
10.0000 mg | INTRAVENOUS | Status: AC | PRN
  Administered 2021-08-14 (×2): 10 mg via INTRAVENOUS

## 2021-08-14 MED ORDER — IBUPROFEN 600 MG PO TABS
600.0000 mg | ORAL_TABLET | Freq: Four times a day (QID) | ORAL | Status: DC
  Administered 2021-08-14 – 2021-08-15 (×4): 600 mg via ORAL
  Filled 2021-08-14 (×4): qty 1

## 2021-08-14 MED ORDER — COCONUT OIL OIL: 1.0000 "application " | TOPICAL_OIL | Status: DC | PRN

## 2021-08-14 MED ORDER — OXYTOCIN-SODIUM CHLORIDE 30-0.9 UT/500ML-% IV SOLN
1.0000 m[IU]/min | INTRAVENOUS | Status: DC
  Administered 2021-08-14: 2 m[IU]/min via INTRAVENOUS
  Filled 2021-08-14: qty 500

## 2021-08-14 MED ORDER — OXYCODONE-ACETAMINOPHEN 5-325 MG PO TABS: 1.0000 | ORAL_TABLET | ORAL | Status: DC | PRN

## 2021-08-14 MED ORDER — OXYTOCIN BOLUS FROM INFUSION
333.0000 mL | Freq: Once | INTRAVENOUS | Status: AC
  Administered 2021-08-14: 333 mL via INTRAVENOUS

## 2021-08-14 MED ORDER — TETANUS-DIPHTH-ACELL PERTUSSIS 5-2.5-18.5 LF-MCG/0.5 IM SUSY: 0.5000 mL | PREFILLED_SYRINGE | Freq: Once | INTRAMUSCULAR | Status: DC

## 2021-08-14 MED ORDER — ACETAMINOPHEN 325 MG PO TABS: 650.0000 mg | ORAL_TABLET | ORAL | Status: DC | PRN

## 2021-08-14 MED ORDER — PHENYLEPHRINE 40 MCG/ML (10ML) SYRINGE FOR IV PUSH (FOR BLOOD PRESSURE SUPPORT)
80.0000 ug | PREFILLED_SYRINGE | INTRAVENOUS | Status: AC | PRN
  Administered 2021-08-14 (×3): 80 ug via INTRAVENOUS

## 2021-08-14 MED ORDER — ONDANSETRON HCL 4 MG PO TABS: 4.0000 mg | ORAL_TABLET | ORAL | Status: DC | PRN

## 2021-08-14 MED ORDER — SENNOSIDES-DOCUSATE SODIUM 8.6-50 MG PO TABS
2.0000 | ORAL_TABLET | Freq: Every day | ORAL | Status: DC
  Administered 2021-08-15: 2 via ORAL
  Filled 2021-08-14: qty 2

## 2021-08-14 MED ORDER — WITCH HAZEL-GLYCERIN EX PADS: 1.0000 "application " | MEDICATED_PAD | CUTANEOUS | Status: DC | PRN

## 2021-08-14 MED ORDER — DIPHENHYDRAMINE HCL 25 MG PO CAPS: 25.0000 mg | ORAL_CAPSULE | Freq: Four times a day (QID) | ORAL | Status: DC | PRN

## 2021-08-14 MED ORDER — PRENATAL MULTIVITAMIN CH
1.0000 | ORAL_TABLET | Freq: Every day | ORAL | Status: DC
  Administered 2021-08-15: 1 via ORAL
  Filled 2021-08-14: qty 1

## 2021-08-14 MED ORDER — FENTANYL-BUPIVACAINE-NACL 0.5-0.125-0.9 MG/250ML-% EP SOLN: 12.0000 mL/h | EPIDURAL | Status: DC | PRN

## 2021-08-14 MED ORDER — TERBUTALINE SULFATE 1 MG/ML IJ SOLN: 0.2500 mg | Freq: Once | INTRAMUSCULAR | Status: DC | PRN

## 2021-08-14 MED ORDER — PHENYLEPHRINE 40 MCG/ML (10ML) SYRINGE FOR IV PUSH (FOR BLOOD PRESSURE SUPPORT)
80.0000 ug | PREFILLED_SYRINGE | INTRAVENOUS | Status: DC | PRN
  Filled 2021-08-14: qty 10

## 2021-08-14 MED ORDER — SOD CITRATE-CITRIC ACID 500-334 MG/5ML PO SOLN: 30.0000 mL | ORAL | Status: DC | PRN

## 2021-08-14 MED ORDER — ONDANSETRON HCL 4 MG/2ML IJ SOLN: 4.0000 mg | Freq: Four times a day (QID) | INTRAMUSCULAR | Status: DC | PRN

## 2021-08-14 MED ORDER — ONDANSETRON HCL 4 MG/2ML IJ SOLN: 4.0000 mg | INTRAMUSCULAR | Status: DC | PRN

## 2021-08-14 MED ORDER — OXYTOCIN-SODIUM CHLORIDE 30-0.9 UT/500ML-% IV SOLN: 2.5000 [IU]/h | INTRAVENOUS | Status: DC | PRN

## 2021-08-14 MED ORDER — LIDOCAINE HCL (PF) 1 % IJ SOLN
INTRAMUSCULAR | Status: DC | PRN
  Administered 2021-08-14: 8 mL via EPIDURAL

## 2021-08-14 MED ORDER — DIPHENHYDRAMINE HCL 50 MG/ML IJ SOLN: 12.5000 mg | INTRAMUSCULAR | Status: DC | PRN

## 2021-08-14 MED ORDER — OXYCODONE-ACETAMINOPHEN 5-325 MG PO TABS: 2.0000 | ORAL_TABLET | ORAL | Status: DC | PRN

## 2021-08-14 MED ORDER — LACTATED RINGERS IV SOLN
500.0000 mL | Freq: Once | INTRAVENOUS | Status: AC
  Administered 2021-08-14: 500 mL via INTRAVENOUS

## 2021-08-14 MED ORDER — ZOLPIDEM TARTRATE 5 MG PO TABS: 5.0000 mg | ORAL_TABLET | Freq: Every evening | ORAL | Status: DC | PRN

## 2021-08-14 MED ORDER — LACTATED RINGERS IV SOLN: 500.0000 mL | INTRAVENOUS | Status: DC | PRN

## 2021-08-14 MED ORDER — DIBUCAINE (PERIANAL) 1 % EX OINT
1.0000 | TOPICAL_OINTMENT | CUTANEOUS | Status: DC | PRN
Start: 2021-08-14 — End: 2021-08-15

## 2021-08-14 MED ORDER — LIDOCAINE HCL (PF) 1 % IJ SOLN: 30.0000 mL | INTRAMUSCULAR | Status: DC | PRN

## 2021-08-14 MED ORDER — EPHEDRINE 5 MG/ML INJ
10.0000 mg | INTRAVENOUS | Status: DC | PRN
  Administered 2021-08-14: 10 mg via INTRAVENOUS
  Filled 2021-08-14: qty 5

## 2021-08-14 MED ORDER — FENTANYL-BUPIVACAINE-NACL 0.5-0.125-0.9 MG/250ML-% EP SOLN
12.0000 mL/h | EPIDURAL | Status: DC | PRN
  Administered 2021-08-14: 12 mL/h via EPIDURAL
  Filled 2021-08-14: qty 250

## 2021-08-14 MED ORDER — LACTATED RINGERS IV SOLN: INTRAVENOUS | Status: DC

## 2021-08-14 MED ORDER — BENZOCAINE-MENTHOL 20-0.5 % EX AERO: 1.0000 "application " | INHALATION_SPRAY | CUTANEOUS | Status: DC | PRN

## 2021-08-14 MED ORDER — SIMETHICONE 80 MG PO CHEW: 80.0000 mg | CHEWABLE_TABLET | ORAL | Status: DC | PRN

## 2021-08-14 MED ORDER — OXYTOCIN-SODIUM CHLORIDE 30-0.9 UT/500ML-% IV SOLN
2.5000 [IU]/h | INTRAVENOUS | Status: DC
  Administered 2021-08-14: 2.5 [IU]/h via INTRAVENOUS

## 2021-08-14 NOTE — Progress Notes (Signed)
RN notes that where Tina Davila catheter stat lock was on patient's leg there is a broken capillary/bruised area.  Shown to pt who states "its no big deal" Will notify MB about this to monitor. ?

## 2021-08-14 NOTE — Progress Notes (Signed)
Tina Davila is a 26 y.o. G2P1001 at [redacted]w[redacted]d admitted for PROM ? ?Subjective: ? ?Pt resting comfortably with epidural in place. Discussed pitocin R/B/A with pt. Pt consented.  ?Objective: ?BP 110/62   Pulse 91   Temp (!) 97.3 ?F (36.3 ?C) (Oral)   Resp 16   Ht 4\' 10"  (1.473 m)   Wt 56.7 kg   LMP 11/13/2020   SpO2 99%   BMI 26.13 kg/m?  ?No intake/output data recorded. ?No intake/output data recorded. ? ?FHT:  FHR: 140 bpm, variability: moderate,  accelerations:  Present,  decelerations:  Absent ?UC:   regular, every 2-4 minutes ?SVE:   Dilation: 4 ?Effacement (%): 70 ?Station: -1 ?Exam by:: [email or web address], CNM ? ?Labs: ?Lab Results  ?Component Value Date  ? WBC 12.6 (H) 08/13/2021  ? HGB 10.3 (L) 08/13/2021  ? HCT 32.2 (L) 08/13/2021  ? MCV 74.0 (L) 08/13/2021  ? PLT 318 08/13/2021  ? ? ?Assessment / Plan: ?PROM clear fluid @ 1930 on 08/13/21. No cervical change since admission. Will start pitocin. ? ?Labor:  Pitocin 2x2 ?Fetal Wellbeing:  Category I ?Pain Control:  Epidural ?I/D:   GBS negative ?Anticipated MOD:  NSVD ? ?10/13/21 MSN, CNM ?08/14/2021, 6:41 AM ?  ?

## 2021-08-14 NOTE — Anesthesia Procedure Notes (Signed)
Epidural ?Patient location during procedure: OB ?Start time: 08/14/2021 3:53 AM ?End time: 08/14/2021 4:00 AM ? ?Staffing ?Anesthesiologist: Janeece Riggers, MD ? ?Preanesthetic Checklist ?Completed: patient identified, IV checked, site marked, risks and benefits discussed, surgical consent, monitors and equipment checked, pre-op evaluation and timeout performed ? ?Epidural ?Patient position: sitting ?Prep: DuraPrep and site prepped and draped ?Patient monitoring: continuous pulse ox and blood pressure ?Approach: midline ?Location: L3-L4 ?Injection technique: LOR air ? ?Needle:  ?Needle type: Tuohy  ?Needle gauge: 17 G ?Needle length: 9 cm and 9 ?Needle insertion depth: 5 cm ?Catheter type: closed end flexible ?Catheter size: 19 Gauge ?Catheter at skin depth: 10 cm ?Test dose: negative ? ?Assessment ?Events: blood not aspirated, injection not painful, no injection resistance, no paresthesia and negative IV test ? ? ? ?

## 2021-08-14 NOTE — Anesthesia Preprocedure Evaluation (Signed)
Anesthesia Evaluation  ?Patient identified by MRN, date of birth, ID band ?Patient awake ? ? ? ?Reviewed: ?Allergy & Precautions, H&P , NPO status , Patient's Chart, lab work & pertinent test results, reviewed documented beta blocker date and time  ? ?Airway ?Mallampati: II ? ?TM Distance: >3 FB ?Neck ROM: full ? ? ? Dental ?no notable dental hx. ? ?  ?Pulmonary ?neg pulmonary ROS,  ?  ?Pulmonary exam normal ?breath sounds clear to auscultation ? ? ? ? ? ? Cardiovascular ?negative cardio ROS ?Normal cardiovascular exam ?Rhythm:regular Rate:Normal ? ? ?  ?Neuro/Psych ?negative neurological ROS ? negative psych ROS  ? GI/Hepatic ?negative GI ROS, Neg liver ROS,   ?Endo/Other  ?negative endocrine ROS ? Renal/GU ?negative Renal ROS  ?negative genitourinary ?  ?Musculoskeletal ? ? Abdominal ?  ?Peds ? Hematology ?negative hematology ROS ?(+)   ?Anesthesia Other Findings ? ? Reproductive/Obstetrics ?(+) Pregnancy ? ?  ? ? ? ? ? ? ? ? ? ? ? ? ? ?  ?  ? ? ? ? ? ? ? ? ?Anesthesia Physical ?Anesthesia Plan ? ?ASA: 2 ? ?Anesthesia Plan: Epidural  ? ?Post-op Pain Management: Minimal or no pain anticipated  ? ?Induction:  ? ?PONV Risk Score and Plan: 2 and Treatment may vary due to age or medical condition ? ?Airway Management Planned: Natural Airway ? ?Additional Equipment: None ? ?Intra-op Plan:  ? ?Post-operative Plan:  ? ?Informed Consent: I have reviewed the patients History and Physical, chart, labs and discussed the procedure including the risks, benefits and alternatives for the proposed anesthesia with the patient or authorized representative who has indicated his/her understanding and acceptance.  ? ? ? ?Dental Advisory Given ? ?Plan Discussed with: Anesthesiologist and CRNA ? ?Anesthesia Plan Comments: (Labs checked- platelets confirmed with RN in room. Fetal heart tracing, per RN, reported to be stable enough for sitting procedure. ?Discussed epidural, and patient consents to the  procedure:  included risk of possible headache,backache, failed block, allergic reaction, and nerve injury. This patient was asked if she had any questions or concerns before the procedure started.)  ? ? ? ? ? ? ?Anesthesia Quick Evaluation ? ?

## 2021-08-14 NOTE — Progress Notes (Signed)
MD LABOR PROGRESS NOTE ? ?Tina Davila is a 26 y.o. G2P1001 at [redacted]w[redacted]d admitted for PROM ? ?Subjective: ?Patient comfortable with epidural ? ? ?Objective: ?BP (!) 101/50   Pulse 70   Temp 98.3 ?F (36.8 ?C) (Oral)   Resp 20   Ht 4\' 10"  (1.473 m)   Wt 56.7 kg   LMP 11/13/2020   SpO2 99%   BMI 26.13 kg/m?  ?No intake/output data recorded. ?Total I/O ?In: -  ?Out: 1000 [Urine:1000] ? ?FHT:  FHR: 125 bpm, variability: moderate,  accelerations:  Present,  decelerations:  Absent ?UC:   irregular, every 3-6 minutes ?SVE:   Dilation: 4 ?Effacement (%): 80 ?Station: -2 ?Exam by:: Foley,rnc ? ?Labs: ?Lab Results  ?Component Value Date  ? WBC 12.6 (H) 08/13/2021  ? HGB 10.3 (L) 08/13/2021  ? HCT 32.2 (L) 08/13/2021  ? MCV 74.0 (L) 08/13/2021  ? PLT 318 08/13/2021  ? ? ?Assessment / Plan: ?Induction of labor due to PROM,  progressing well on pitocin ? ?Labor:  Patient still in latent labor ?Fetal Wellbeing:  Category I ?Pain Control:  Epidural ?I/D:  n/a ?Anticipated MOD:  NSVD ? ?10/13/2021 MD ?08/14/2021, 9:33 AM ?  ?

## 2021-08-15 LAB — CBC
HCT: 29.6 % — ABNORMAL LOW (ref 36.0–46.0)
Hemoglobin: 9.1 g/dL — ABNORMAL LOW (ref 12.0–15.0)
MCH: 23 pg — ABNORMAL LOW (ref 26.0–34.0)
MCHC: 30.7 g/dL (ref 30.0–36.0)
MCV: 74.7 fL — ABNORMAL LOW (ref 80.0–100.0)
Platelets: 295 10*3/uL (ref 150–400)
RBC: 3.96 MIL/uL (ref 3.87–5.11)
RDW: 14.6 % (ref 11.5–15.5)
WBC: 15.3 10*3/uL — ABNORMAL HIGH (ref 4.0–10.5)
nRBC: 0 % (ref 0.0–0.2)

## 2021-08-15 MED ORDER — IBUPROFEN 600 MG PO TABS: 600.0000 mg | ORAL_TABLET | Freq: Four times a day (QID) | ORAL | 3 refills | Status: DC | PRN

## 2021-08-15 MED ORDER — MEASLES, MUMPS & RUBELLA VAC IJ SOLR
0.5000 mL | Freq: Once | INTRAMUSCULAR | Status: AC
  Administered 2021-08-15: 0.5 mL via SUBCUTANEOUS
  Filled 2021-08-15: qty 0.5

## 2021-08-15 NOTE — Social Work (Signed)
MOB was referred for history of anxiety.  ? ?* Referral screened out by Clinical Social Worker because none of the following criteria appear to apply: ? ?~ History of anxiety/depression during this pregnancy, or of post-partum depression following prior delivery. ?~ Diagnosis of anxiety and/or depression within last 3 years. CSW reviewed chart and notes MOB was diagnosed in 2016 and identified anxiety as situational.  ?OR ?* MOB's symptoms currently being treated with medication and/or therapy. ? ?MOB scored a 0 on the Edinburgh Postpartum Depression Screen.  ? ?Please contact the Clinical Social Worker if needs arise or by [facility] request. ? ?Manfred Arch, LCSWA ?Clinical Social Work ?[facility]  ?[phone]  ?

## 2021-08-15 NOTE — Lactation Note (Signed)
This note was copied from a baby's chart. ?Lactation Consultation Note ? ?Patient Name: Tina Davila ?Today's Date: 08/15/2021 ?Reason for consult: Initial assessment;Term ?Age:24 hours, term female infant -4% weight loss. ?Mom is working on increasing duration of infant feeding, previously infant  has been BF for 9 or ten minutes the total feeding. ?Previously mom was not breastfeeding infant skin to skin. ?LC discussed with parents to do breast stimulation techniques to keep infant awake while BF such as: talking to infant, gently stroking neck, shoulder and arms and doing breast compression. ?Parents understand not to let infant hang out and not actively feed at the breast.  ?Mom latched infant on her right breast using the cross cradle hold and applied BF stimulation techniques, swallows were heard and infant latched with depth BF for 15 minutes, afterwards mom burped infant. ?LC discussed hand expression with breast model and mom self expressed 2 mls of colostrum that was spoon fed to infant afterwards infant start cuing again and mom re-latched infant on her left breast, using cross cradle hold, infant BF additional 15 minutes.  ?Total feeding was 30 minutes. ?LC gave mom hand pump and explained how to use, mom easily expressed 1 mls that was finger feed to infant. ?LC discussed discharge education see interventions below and gave mom online resources " [email or web address] as well as LC hotline, LC online breastfeeding support group. ?Mom understand that infant may cluster feed tonight and that this is normal behavior. ?Mom's plan:  ?1- BF infant according to hunger cues, 8 to 12+ times within 24 hours, skin to skin. ?2- Continue to use breast stimulation techniques to keep infant actively feeding at breast and breast feed infant on both breast during a feeding.  ?3- Mom knows after latching infant at breast she can offer her EBM that she hand expressed or pumped by spoon. ?4- Mom will keep Pediatrician MD  appointment on Monday and follow up with LC at her Pediatrician's office if needed.  ?Maternal Data ?Has patient been taught Hand Expression?: Yes ?Does the patient have breastfeeding experience prior to this delivery?: Yes ?How long did the patient breastfeed?: Per mom, she BF 1st child for 3 months before returning to work and child having teeth. ? ?Feeding ?Mother's Current Feeding Choice: Breast Milk ? ?LATCH Score ?Latch: Grasps breast easily, tongue down, lips flanged, rhythmical sucking. ? ?Audible Swallowing: A few with stimulation ? ?Type of Nipple: Everted at rest and after stimulation ? ?Comfort (Breast/Nipple): Soft / non-tender ? ?Hold (Positioning): Assistance needed to correctly position infant at breast and maintain latch. ? ?LATCH Score: 8 ? ? ?Lactation Tools Discussed/Used ?  ? ?Interventions ?Interventions: Breast feeding basics reviewed;Assisted with latch;Skin to skin;Breast compression;Adjust position;Support pillows;Position options;Expressed milk;Hand pump;Education;LC Services brochure ? ?Discharge ?Discharge Education: Engorgement and breast care;Warning signs for feeding baby ? ?Consult Status ?Consult Status: Complete ?Date: 08/15/21 ?Follow-up type: Physician ? ? ? ?Danelle Earthly ?08/15/2021, 4:58 PM ? ? ? ?

## 2021-08-15 NOTE — Discharge Summary (Signed)
?[facility] Discharge Summary ? ? ?Patient Name:   Tina Davila ?DOB:     10-07-1995 ?MRN:     [id number] ? ?Date of Admission:   08/13/2021 ?Date of Discharge:  08/15/2021 ? ?Admitting diagnosis:   ? ?Normal labor and delivery [O80] ?Principal Problem: ?  SVD (spontaneous vaginal delivery) ?Active Problems: ?  Normal labor and delivery ?PROM ?   ?Discharge diagnosis:   ? ?Normal labor and delivery [O80] ?Principal Problem: ?  SVD (spontaneous vaginal delivery) ?Active Problems: ?  Normal labor and delivery ? ?Term Pregnancy Delivered       ? ?Additional problems: None  ?                                        ?Post partum procedures: none ?Augmentation: Pitocin ?Complications: None ? ?Hospital course: Onset of Labor With Vaginal Delivery      ?26 y.o. yo G2P2002 at 56w1dwas admitted in Latent Labor PROM on 08/13/2021. Patient had an uncomplicated labor course as follows:  ?Membrane Rupture Time/Date: 7:30 PM ,08/13/2021   ?Delivery Method:Vaginal, Spontaneous  ?Episiotomy: None  ?Lacerations:  1st degree;Perineal  ?Patient had an uncomplicated postpartum course.  She is ambulating, tolerating a regular diet, passing flatus, and urinating well. Patient is discharged home in stable condition on 08/15/21. ? ?Newborn Data: ?Birth date:08/14/2021  ?Birth time:2:40 PM  ?Gender:Female  ?Living status:Living  ?Apgars:8 ,9  ?Weight:3110 g  ? ?Magnesium Sulfate received: No ?BMZ received: No ?Rhophylac:No ?MMR:No ?T-DaP:Given prenatally ?Flu: No ?Transfusion:No ?                                                              ?Type of Delivery:  NSVD ?Delivering Provider: PSanjuana Kava ?Date of Delivery:  08/14/21 ? ?Newborn Data:  ?Baby Feeding:   Breast ?Disposition:   home with mother ? ?Physical Exam: ?  ?Vitals:  ? 08/14/21 1740 08/14/21 2116 08/15/21 0200 08/15/21 0527  ?BP: 127/70 114/71 (!) 98/54 101/61  ?Pulse: 78 74 67 86  ?Resp: 18 19 19 19   ?Temp: 99 ?F (37.2 ?C)  98.3 ?F (36.8 ?C) 97.7 ?F (36.5 ?C)   ?TempSrc: Oral  Oral Oral  ?SpO2: 100% 100% 100% 98%  ?Weight:      ?Height:      ? ?General: alert, cooperative, and no distress ?Lochia: appropriate ?Uterine Fundus: firm ?Incision: N/A ?DVT Evaluation: No evidence of DVT seen on physical exam. ?Negative Homan's sign. ?No cords or calf tenderness. ? ?Labs: ?Lab Results  ?Component Value Date  ? WBC 15.3 (H) 08/15/2021  ? HGB 9.1 (L) 08/15/2021  ? HCT 29.6 (L) 08/15/2021  ? MCV 74.7 (L) 08/15/2021  ? PLT 295 08/15/2021  ? ?No flowsheet data found. ? ?Discharge instruction: per After Visit Summary and "Baby and Me Booklet". ? ?After Visit Meds:  ?Allergies as of 08/15/2021   ?No Known Allergies ?  ? ?  ?Medication List  ?  ? ?TAKE these medications   ? ?ibuprofen 600 MG tablet ?Commonly known as: ADVIL ?Take 1 tablet (600 mg total) by mouth every 6 (six) hours as needed for cramping or moderate pain. ?  ?prenatal multivitamin Tabs tablet ?Take  1 tablet by mouth daily at 12 noon. ?  ? ?  ? ? ?Diet: routine diet ? ?Activity: Advance as tolerated. Pelvic rest for 6 weeks.  ? ?Outpatient follow up:6 weeks ?Follow up Appt:No future appointments. ?Follow up visit: No follow-ups on file. ? ?Postpartum contraception: Vasectomy ? ?08/15/2021 ?Sanjuana Kava, MD ? ?  ?

## 2021-08-15 NOTE — Anesthesia Postprocedure Evaluation (Signed)
Anesthesia Post Note ? ?Patient: Product manager ? ?Procedure(s) Performed: AN AD HOC LABOR EPIDURAL ? ?  ? ?Patient location during evaluation: Mother Baby ?Anesthesia Type: Epidural ?Level of consciousness: awake and alert ?Pain management: pain level controlled ?Vital Signs Assessment: post-procedure vital signs reviewed and stable ?Respiratory status: spontaneous breathing, nonlabored ventilation and respiratory function stable ?Cardiovascular status: stable ?Postop Assessment: no headache, no backache and epidural receding ?Anesthetic complications: no ? ? ?No notable events documented. ? ?Last Vitals:  ?Vitals:  ? 08/15/21 0200 08/15/21 0527  ?BP: (!) 98/54 101/61  ?Pulse: 67 86  ?Resp: 19 19  ?Temp: 36.8 ?C 36.5 ?C  ?SpO2: 100% 98%  ?  ?Last Pain:  ?Vitals:  ? 08/15/21 0527  ?TempSrc: Oral  ?PainSc:   ? ?Pain Goal: Patients Stated Pain Goal: 0 (08/13/21 2131) ? ?  ?  ?  ?  ?  ?  ?  ? ?Carthel Castille ? ? ? ? ?

## 2021-08-24 ENCOUNTER — Telehealth (HOSPITAL_COMMUNITY): Payer: Self-pay | Admitting: *Deleted

## 2021-08-24 NOTE — Telephone Encounter (Signed)
Patient inquired about normal bleeding patterns after delivery. Discussed with patient. Reviewed signs to report to MD - patient verbalized understanding. Patient denied soaking a pad or more in an hour; denied passing large clots; denied foul smelling discharge. Patient voiced no other questions or concerns at this time. EPDS=0. Patient reports formula feeding infant. Stated that he "occasionally spits up about a half an ounce." RN encouraged frequent burping and holding infant upright for approximately after a feed. Patient verbalized understanding. RN instructed patient to call pediatrician if frequency increases, if amount increases, if projectile vomiting is observed, or if patient is concerned. Next peds appointment is scheduled for 3/24 per patient report. Patient voiced no other questions or concerns regarding infant at this time. Patient reports infant sleeps in a bassinet on his back. RN reviewed ABCs of safe sleep. Patient verbalized understanding. Patient informed about hospital's virtual postpartum classes and support groups - email information declined at this time. Deforest Hoyles, RN, 08/24/21, 1955  ?

## 2021-09-30 ENCOUNTER — Other Ambulatory Visit: Payer: Self-pay

## 2021-09-30 ENCOUNTER — Encounter (HOSPITAL_BASED_OUTPATIENT_CLINIC_OR_DEPARTMENT_OTHER): Payer: Self-pay | Admitting: Emergency Medicine

## 2021-09-30 ENCOUNTER — Emergency Department (HOSPITAL_BASED_OUTPATIENT_CLINIC_OR_DEPARTMENT_OTHER): Payer: Medicaid Other | Admitting: Radiology

## 2021-09-30 ENCOUNTER — Emergency Department (HOSPITAL_BASED_OUTPATIENT_CLINIC_OR_DEPARTMENT_OTHER)
Admission: EM | Admit: 2021-09-30 | Discharge: 2021-09-30 | Disposition: A | Discharge status: Home / Self Care | Payer: Medicaid Other | Attending: Emergency Medicine | Admitting: Emergency Medicine

## 2021-09-30 DIAGNOSIS — R0602 Shortness of breath: Secondary | ICD-10-CM | POA: Insufficient documentation

## 2021-09-30 DIAGNOSIS — M791 Myalgia, unspecified site: Secondary | ICD-10-CM | POA: Insufficient documentation

## 2021-09-30 DIAGNOSIS — R110 Nausea: Secondary | ICD-10-CM | POA: Diagnosis not present

## 2021-09-30 DIAGNOSIS — R000 Tachycardia, unspecified: Secondary | ICD-10-CM | POA: Diagnosis not present

## 2021-09-30 DIAGNOSIS — Z20822 Contact with and (suspected) exposure to covid-19: Secondary | ICD-10-CM | POA: Diagnosis not present

## 2021-09-30 DIAGNOSIS — R0789 Other chest pain: Secondary | ICD-10-CM | POA: Insufficient documentation

## 2021-09-30 DIAGNOSIS — R197 Diarrhea, unspecified: Secondary | ICD-10-CM | POA: Diagnosis not present

## 2021-09-30 DIAGNOSIS — R5383 Other fatigue: Secondary | ICD-10-CM | POA: Insufficient documentation

## 2021-09-30 DIAGNOSIS — R079 Chest pain, unspecified: Secondary | ICD-10-CM

## 2021-09-30 LAB — URINALYSIS, ROUTINE W REFLEX MICROSCOPIC
Bilirubin Urine: NEGATIVE
Glucose, UA: NEGATIVE mg/dL
Ketones, ur: NEGATIVE mg/dL
Leukocytes,Ua: NEGATIVE
Nitrite: NEGATIVE
Protein, ur: NEGATIVE mg/dL
RBC / HPF: >50 RBC/hpf — ABNORMAL HIGH (ref 0–5)
Specific Gravity, Urine: 1.01 (ref 1.005–1.030)
pH: 5.5 (ref 5.0–8.0)

## 2021-09-30 LAB — RESP PANEL BY RT-PCR (FLU A&B, COVID) ARPGX2
Influenza A by PCR: NEGATIVE
Influenza B by PCR: NEGATIVE
SARS Coronavirus 2 by RT PCR: NEGATIVE

## 2021-09-30 LAB — CBC
HCT: 39.6 % (ref 36.0–46.0)
Hemoglobin: 12.2 g/dL (ref 12.0–15.0)
MCH: 22.8 pg — ABNORMAL LOW (ref 26.0–34.0)
MCHC: 30.8 g/dL (ref 30.0–36.0)
MCV: 74.2 fL — ABNORMAL LOW (ref 80.0–100.0)
Platelets: 443 10*3/uL — ABNORMAL HIGH (ref 150–400)
RBC: 5.34 MIL/uL — ABNORMAL HIGH (ref 3.87–5.11)
RDW: 20.6 % — ABNORMAL HIGH (ref 11.5–15.5)
WBC: 9.9 10*3/uL (ref 4.0–10.5)
nRBC: 0 % (ref 0.0–0.2)

## 2021-09-30 LAB — HEPATIC FUNCTION PANEL
ALT: 44 U/L (ref 0–44)
AST: 35 U/L (ref 15–41)
Albumin: 4.8 g/dL (ref 3.5–5.0)
Alkaline Phosphatase: 80 U/L (ref 38–126)
Bilirubin, Direct: 0.1 mg/dL (ref 0.0–0.2)
Indirect Bilirubin: 0.4 mg/dL (ref 0.3–0.9)
Total Bilirubin: 0.5 mg/dL (ref 0.3–1.2)
Total Protein: 7.7 g/dL (ref 6.5–8.1)

## 2021-09-30 LAB — BASIC METABOLIC PANEL
Anion gap: 10 (ref 5–15)
BUN: 10 mg/dL (ref 6–20)
CO2: 24 mmol/L (ref 22–32)
Calcium: 9.6 mg/dL (ref 8.9–10.3)
Chloride: 105 mmol/L (ref 98–111)
Creatinine, Ser: 0.8 mg/dL (ref 0.44–1.00)
GFR, Estimated: >60 mL/min (ref >60)
Glucose, Bld: 97 mg/dL (ref 70–99)
Potassium: 3.9 mmol/L (ref 3.5–5.1)
Sodium: 139 mmol/L (ref 135–145)

## 2021-09-30 LAB — D-DIMER, QUANTITATIVE: D-Dimer, Quant: 0.46 ug/mL-FEU (ref 0.00–0.50)

## 2021-09-30 LAB — TROPONIN I (HIGH SENSITIVITY): Troponin I (High Sensitivity): <2 ng/L (ref <18)

## 2021-09-30 LAB — LIPASE, BLOOD: Lipase: 53 U/L — ABNORMAL HIGH (ref 11–51)

## 2021-09-30 LAB — PREGNANCY, URINE: Preg Test, Ur: NEGATIVE

## 2021-09-30 MED ORDER — IBUPROFEN 600 MG PO TABS: 600.0000 mg | ORAL_TABLET | Freq: Four times a day (QID) | ORAL | 0 refills | Status: DC | PRN

## 2021-09-30 MED ORDER — DICYCLOMINE HCL 10 MG PO CAPS
20.0000 mg | ORAL_CAPSULE | Freq: Once | ORAL | Status: AC
  Administered 2021-09-30: 20 mg via ORAL
  Filled 2021-09-30: qty 2

## 2021-09-30 MED ORDER — ONDANSETRON 4 MG PO TBDP: 4.0000 mg | ORAL_TABLET | Freq: Three times a day (TID) | ORAL | 0 refills | Status: DC | PRN

## 2021-09-30 MED ORDER — DICYCLOMINE HCL 20 MG PO TABS
20.0000 mg | ORAL_TABLET | Freq: Two times a day (BID) | ORAL | 0 refills | Status: DC
Start: 2021-09-30 — End: 2023-09-02

## 2021-09-30 MED ORDER — ACETAMINOPHEN 325 MG PO TABS
650.0000 mg | ORAL_TABLET | Freq: Once | ORAL | Status: AC
Start: 2021-09-30 — End: 2021-09-30
  Administered 2021-09-30: 650 mg via ORAL
  Filled 2021-09-30: qty 2

## 2021-09-30 MED ORDER — ONDANSETRON HCL 4 MG/2ML IJ SOLN
4.0000 mg | Freq: Once | INTRAMUSCULAR | Status: AC
  Administered 2021-09-30: 4 mg via INTRAVENOUS
  Filled 2021-09-30: qty 2

## 2021-09-30 MED ORDER — SODIUM CHLORIDE 0.9 % IV BOLUS
1000.0000 mL | Freq: Once | INTRAVENOUS | Status: AC
  Administered 2021-09-30: 1000 mL via INTRAVENOUS

## 2021-09-30 NOTE — ED Provider Notes (Signed)
?[facility] ?Provider Note ? ? ?CSN: [id number] ?Arrival date & time: 09/30/21  1241 ? ?  ? ?History ? ?Chief Complaint  ?Patient presents with  ? Chest Pain  ? ? ?Perline Awe is a 26 y.o. female. ? ? ?Chest Pain ?Patient is a 26 year old female with no medical problems she is presented emergency room today with 3 days of chest tightness and some shortness of breath she states that she then developed some nausea and had some loose poop that she states was not watery but would describe as diarrhea she had several episodes of this no blood in her stool.  No fevers at home.  She states she has had some generalized aches and fatigue. ? ?  ? ?Home Medications ?Prior to Admission medications   ?Medication Sig Start Date End Date Taking? Authorizing Provider  ?dicyclomine (BENTYL) 20 MG tablet Take 1 tablet (20 mg total) by mouth 2 (two) times daily. 09/30/21  Yes Kol Consuegra, Ova Freshwater S, PA  ?ibuprofen (ADVIL) 600 MG tablet Take 1 tablet (600 mg total) by mouth every 6 (six) hours as needed. 09/30/21  Yes Donte Kary, Ova Freshwater S, PA  ?ondansetron (ZOFRAN-ODT) 4 MG disintegrating tablet Take 1 tablet (4 mg total) by mouth every 8 (eight) hours as needed for nausea or vomiting. 09/30/21  Yes Tedd Sias, PA  ?   ? ?Allergies    ?Patient has no known allergies.   ? ?Review of Systems   ?Review of Systems  ?Cardiovascular:  Positive for chest pain.  ? ?Physical Exam ?Updated Vital Signs ?BP 103/68   Pulse 80   Temp 98.6 ?F (37 ?C) (Oral)   Resp (!) 22   SpO2 100%  ?Physical Exam ?Vitals and nursing note reviewed.  ?Constitutional:   ?   General: She is not in acute distress. ?   Comments: Patient is a fatigued appearing 26 year old female nontoxic but uncomfortable appearing.  ?HENT:  ?   Head: Normocephalic and atraumatic.  ?   Nose: Nose normal.  ?   Mouth/Throat:  ?   Mouth: Mucous membranes are dry.  ?Eyes:  ?   General: No scleral icterus. ?Cardiovascular:  ?   Rate and Rhythm: Regular  rhythm. Tachycardia present.  ?   Pulses: Normal pulses.  ?   Heart sounds: Normal heart sounds.  ?Pulmonary:  ?   Effort: Pulmonary effort is normal. No respiratory distress.  ?   Breath sounds: Normal breath sounds. No wheezing.  ?Abdominal:  ?   Palpations: Abdomen is soft.  ?   Tenderness: There is no abdominal tenderness. There is no guarding or rebound.  ?Musculoskeletal:  ?   Cervical back: Normal range of motion.  ?   Right lower leg: No edema.  ?   Left lower leg: No edema.  ?Skin: ?   General: Skin is warm and dry.  ?   Capillary Refill: Capillary refill takes less than 2 seconds.  ?Neurological:  ?   Mental Status: She is alert. Mental status is at baseline.  ?Psychiatric:     ?   Mood and Affect: Mood normal.     ?   Behavior: Behavior normal.  ? ? ?ED Results / Procedures / Treatments   ?Labs ?(all labs ordered are listed, but only abnormal results are displayed) ?Labs Reviewed  ?CBC - Abnormal; Notable for the following components:  ?    Result Value  ? RBC 5.34 (*)   ? MCV 74.2 (*)   ? Ross  22.8 (*)   ? RDW 20.6 (*)   ? Platelets 443 (*)   ? All other components within normal limits  ?URINALYSIS, ROUTINE W REFLEX MICROSCOPIC - Abnormal; Notable for the following components:  ? Color, Urine COLORLESS (*)   ? Hgb urine dipstick MODERATE (*)   ? RBC / HPF >50 (*)   ? Bacteria, UA RARE (*)   ? All other components within normal limits  ?LIPASE, BLOOD - Abnormal; Notable for the following components:  ? Lipase 53 (*)   ? All other components within normal limits  ?RESP PANEL BY RT-PCR (FLU A&B, COVID) ARPGX2  ?BASIC METABOLIC PANEL  ?D-DIMER, QUANTITATIVE  ?PREGNANCY, URINE  ?HEPATIC FUNCTION PANEL  ?TROPONIN I (HIGH SENSITIVITY)  ? ? ?EKG ?None ? ?Radiology ?DG Chest 2 View ? ?Result Date: 09/30/2021 ?CLINICAL DATA:  Chest pain, upper abdominal pain, nausea. Recently postpartum. EXAM: CHEST - 2 VIEW COMPARISON:  12/04/2020. FINDINGS: Trachea is midline. Heart size normal. Lungs are clear. No pleural fluid.  Visualized upper abdomen is unremarkable. IMPRESSION: Negative. Electronically Signed   By: Lorin Picket M.D.   On: 09/30/2021 13:10   ? ?Procedures ?Procedures  ? ? ?Medications Ordered in ED ?Medications  ?sodium chloride 0.9 % bolus 1,000 mL (0 mLs Intravenous Stopped 09/30/21 1429)  ?ondansetron [facility]) injection 4 mg (4 mg Intravenous Given 09/30/21 1326)  ?dicyclomine (BENTYL) capsule 20 mg (20 mg Oral Given 09/30/21 1322)  ?acetaminophen (TYLENOL) tablet 650 mg (650 mg Oral Given 09/30/21 1326)  ? ? ?ED Course/ Medical Decision Making/ A&P ?Clinical Course as of 09/30/21 1815  ?Wed Sep 30, 2021  ?1315 3 days ago chest tightness and some SOB.  ?Worse with deep breathing.  [WF]  ?1315 Yesterday nausea and loose poop [WF]  ?1331 D-Dimer, Quant: 0.46 ?D-dimer within normal limits.  I have low suspicion for PE given her myriad symptoms of GI discomfort, nausea, loose stool, fatigue.  I suspect her tachycardia secondary to dehydration.  She has no specific PE risk factors specifically detailed the negative risk factors I discussed with her below. ? ?No recent surgeries, hospitalization, long travel, hemoptysis, estrogen containing OCP, cancer history.  No unilateral leg swelling.  No history of PE or VTE.  [WF]  ?  ?Clinical Course User Index ?[WF] Tedd Sias, [place]  ? ?                        ?Medical Decision Making ?Amount and/or Complexity of Data Reviewed ?Labs: ordered. Decision-making details documented in ED Course. ?Radiology: ordered. ? ?Risk ?OTC drugs. ?Prescription drug management. ? ? ?This patient presents to the ED for concern of SOB/CP/abd pain nausea and diarrhea, this involves a number of treatment options, and is a complaint that carries with it a moderate-high risk of complications and morbidity.  The differential diagnosis includes PE give SOB and chest pain some concern for myocarditis also considered pneumothorax pneumonia or viral illness.  I had a ladder these most likely given her  presentation. ? ? ?Co morbidities: ?Discussed in HPI ? ? ?Brief History: ? ?Patient is a 26 year old female with no medical problems she is presented emergency room today with 3 days of chest tightness and some shortness of breath she states that she then developed some nausea and had some loose poop that she states was not watery but would describe as diarrhea she had several episodes of this no blood in her stool.  No fevers at home.  She  states she has had some generalized aches and fatigue. ? ?Physical exam notable for dry oral mucosa.  Tachycardia.  Clear lung sounds. ? ?EMR reviewed including pt PMHx, past surgical history and past visits to ER.  ? ?See HPI for more details ? ? ?Lab Tests: ? ? ?I ordered and independently interpreted labs. Labs notable for CBC with mild erythrocytosis 5.34 consistent with dehydration.  BMP unremarkable.  LFTs unremarkable lipase marginally elevated but perhaps due to hemoconcentration no epigastric tenderness on palpation doubt pancreatitis.  Pregnancy urinalysis negative.  COVID influenza negative.  D-dimer within normal limits doubt PE medication for further work-up.  Troponin x1 within normal limits her symptoms have been ongoing for several days.  Will not repeat.  Doubt myocarditis. ? ? ?Imaging Studies: ? ?NAD. I personally reviewed all imaging studies and no acute abnormality found. I agree with radiology interpretation. ? ? ? ?Cardiac Monitoring: ? ?The patient was maintained on a cardiac monitor.  I personally viewed and interpreted the cardiac monitored which showed an underlying rhythm of: Sinus tachycardia ?EKG non-ischemic sinus tach ? ? ?Medicines ordered: ? ?I ordered medication including 1L NS, tylenol, zofran, bentyl  for abd pain  ?Reevaluation of the patient after these medicines showed that the patient resolved ?I have reviewed the patients home medicines and have made adjustments as needed ? ? ?Critical Interventions: ? ? ? ? ?Consults/Attending  Physician ? ? ? ? ? ?Reevaluation: ? ?After the interventions noted above I re-evaluated patient and found that they have :resolved ? ? ?Social Determinants of Health: ? ?The patient's social determinants of health were a f

## 2021-09-30 NOTE — ED Notes (Signed)
ED Provider at bedside. 

## 2021-09-30 NOTE — ED Notes (Signed)
EMT-P provided AVS using Teachback Method. Patient verbalizes understanding of Discharge Instructions. Opportunity for Questioning and Answers were provided by EMT-P. Patient Discharged from ED.  ? ?

## 2021-09-30 NOTE — Discharge Instructions (Signed)
Please drink plenty of water, use Zofran for nausea, Bentyl as needed for abdominal cramping. ? ?Return to the ER for any new or concerning symptoms otherwise follow-up with your primary care provider. ?

## 2021-09-30 NOTE — ED Triage Notes (Signed)
Chest pain for 2 days intermittent when moving. Epigastric pain. Last night started n/v/d.  ?

## 2022-02-11 IMAGING — US US MFM OB COMP +14 WKS
1 series · 13 of 28 positions shown · non-contrast
Comparison: none

[Series 1: us mfm ob comp +14 wks · 90 acquisitions, 13 frames shown]
[im 4/90]
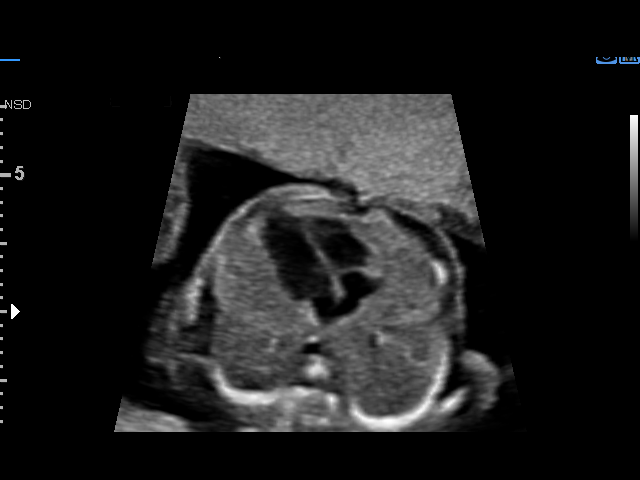
[im 10/90]
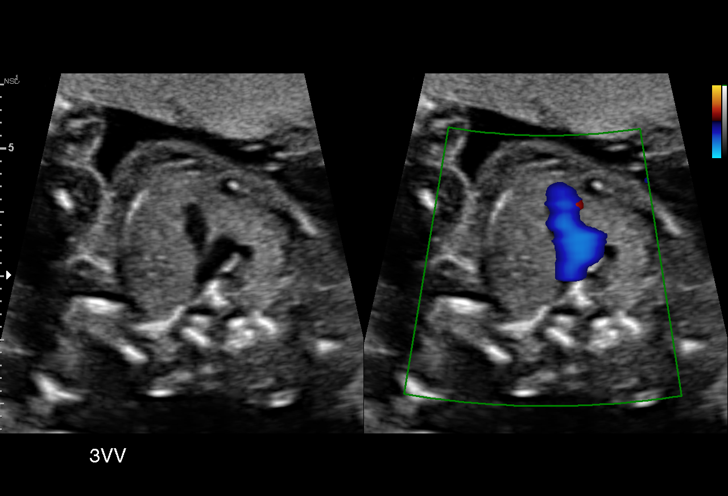
[im 17/90]
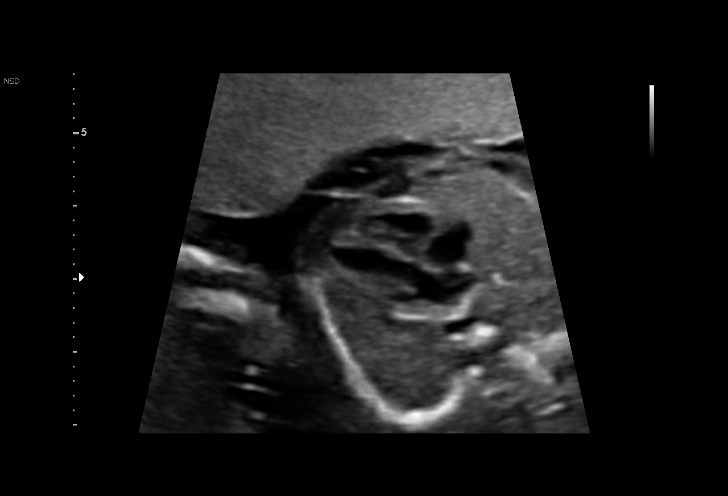
[im 24/90]
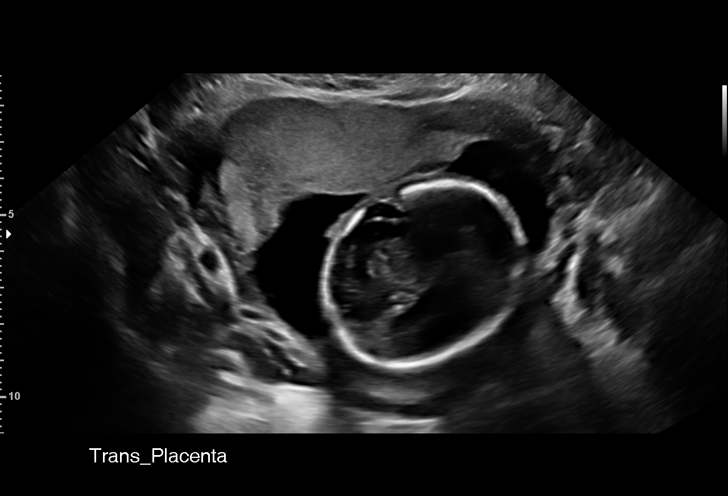
[im 30/90]
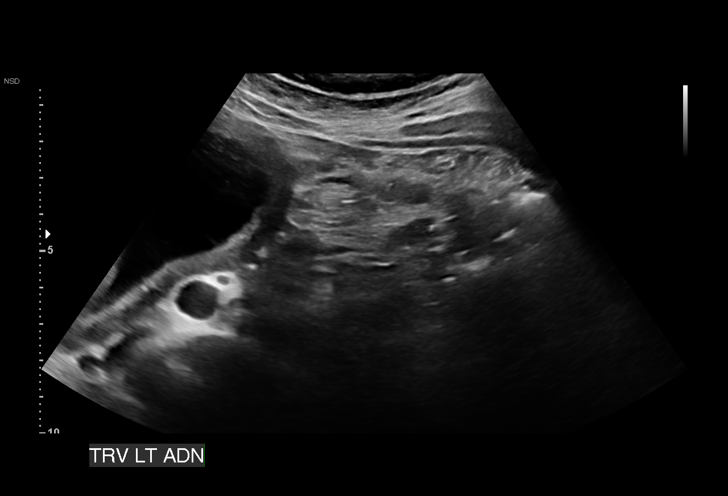
[im 37/90]
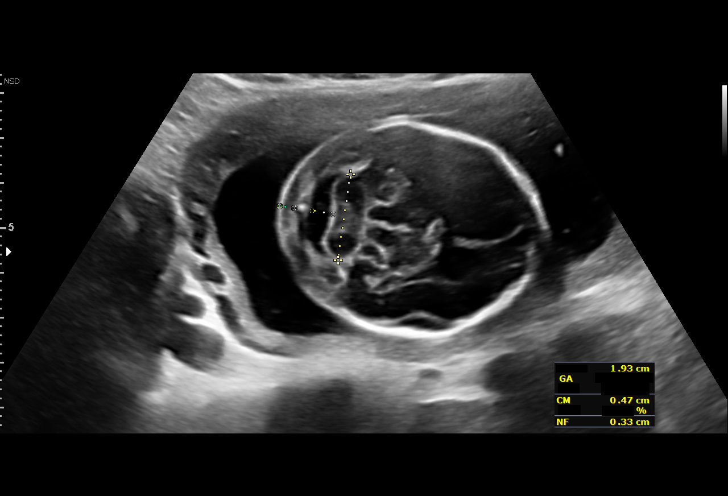
[im 47/90]
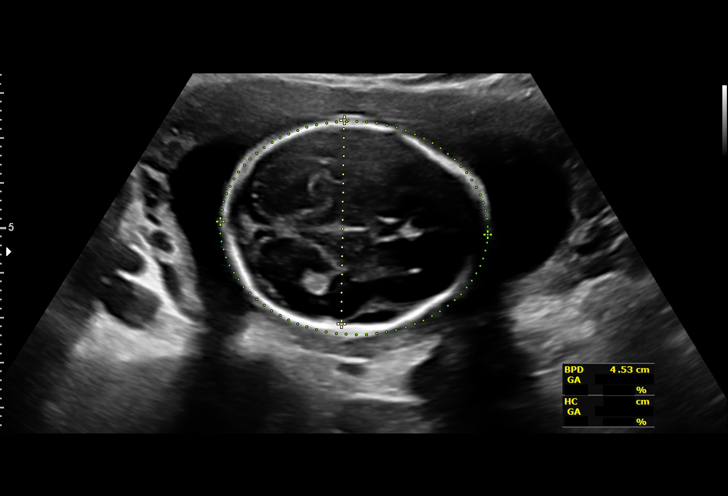
[im 53/90]
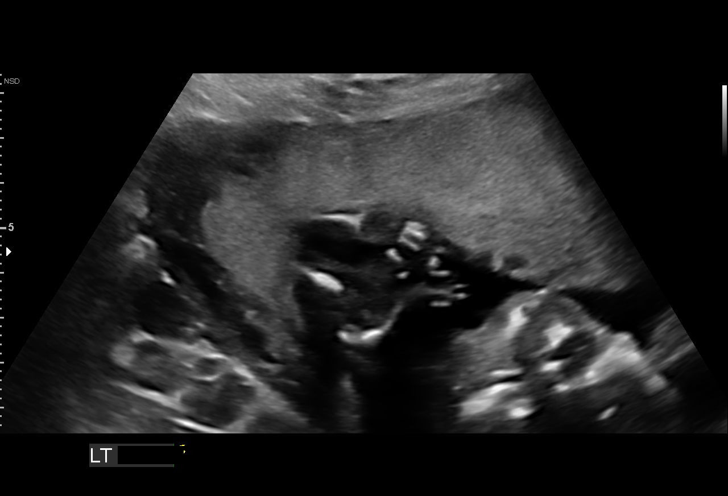
[im 60/90]
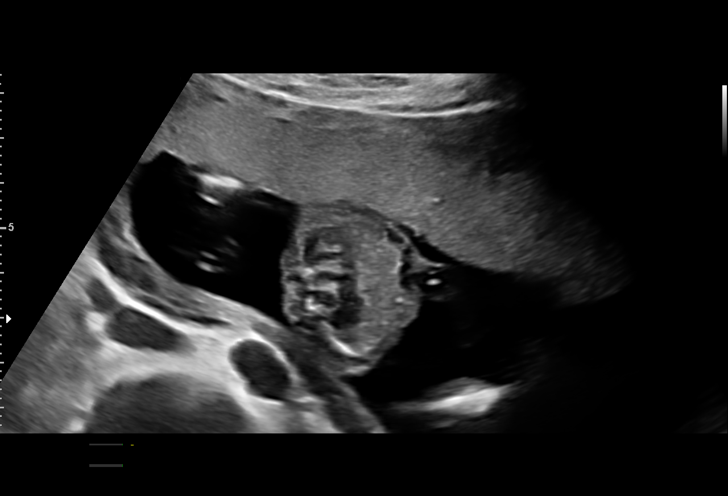
[im 66/90]
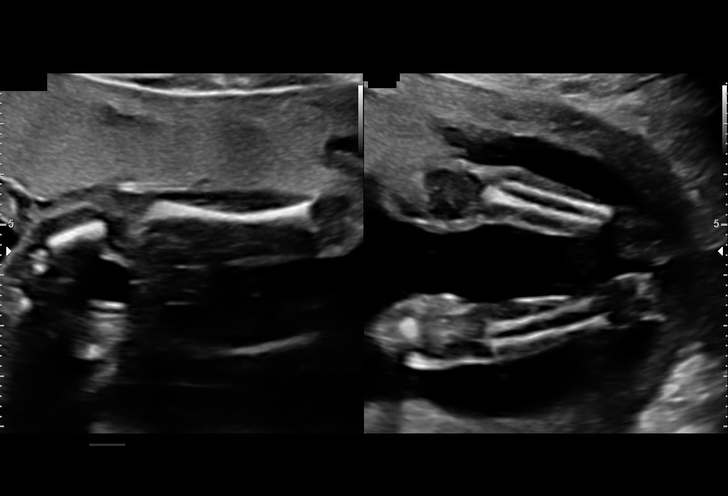
[im 73/90]
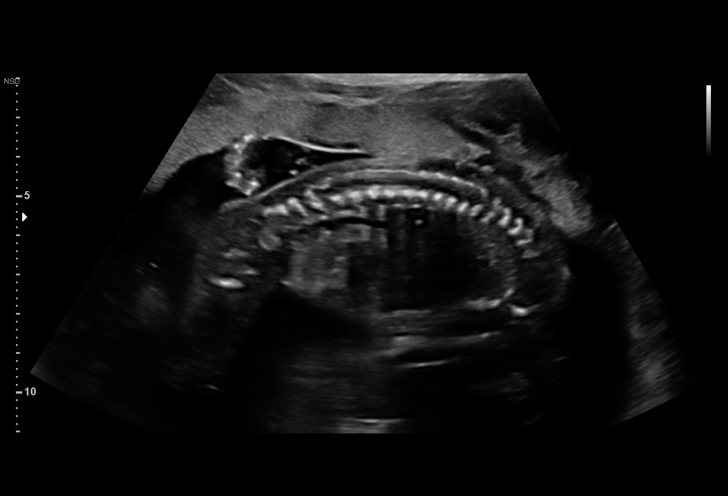
[im 80/90]
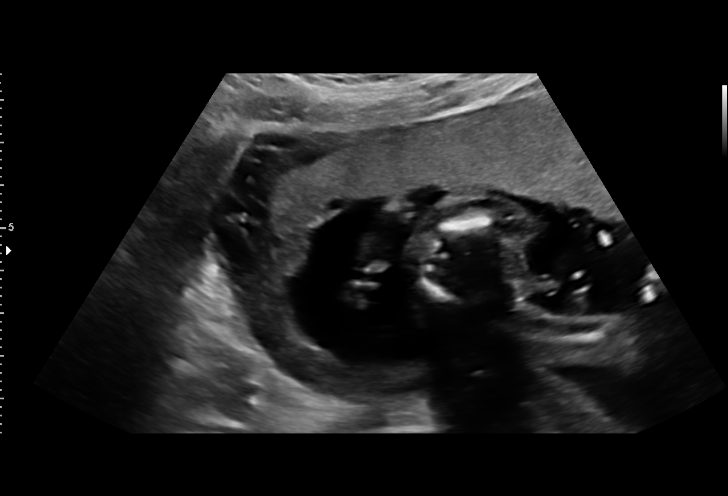
[im 86/90]
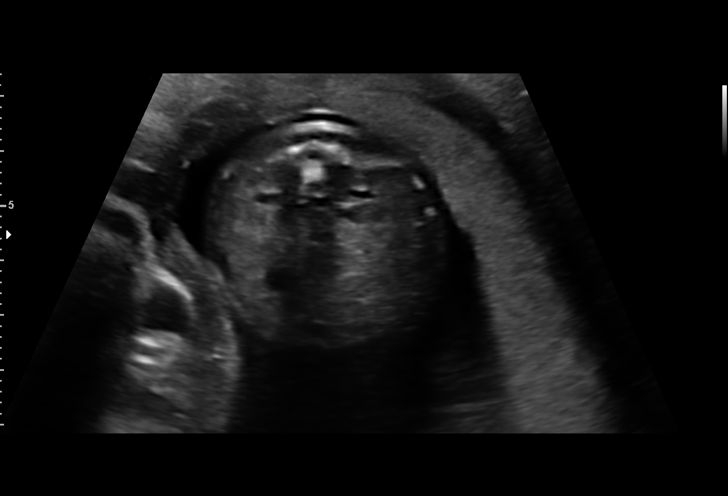

[13 of 28 positions shown; findings below may reference images not displayed]

[facility]                                        Obstetrics &
                                                              Gynecology
                                                              [id number]
                                                              [place].

 1   US MFM OB COMP + 14 WK               [address].01     SHRADDHA FEATHERSTONE

Indications

 Encounter for antenatal screening for
 malformations
 19 weeks gestation of pregnancy
 Late prenatal care, second trimester (Start
 Date 10-17-2019)
Fetal Evaluation

 Num Of Fetuses:          1
 Fetal Heart Rate(bpm):   158
 Cardiac Activity:        Observed
 Presentation:            Cephalic
 Placenta:                Anterior
 P. Cord Insertion:       Visualized, central

 Amniotic Fluid
 AFI FV:      Within normal limits

                             Largest Pocket(cm)

Biometry

 BPD:      45.5   mm     G. Age:  19w 5d         81  %    CI:          73.9  %    70 - 86
                                                          FL/HC:       17.7  %    16.1 -
 HC:      168.1   mm     G. Age:  19w 3d         65  %    HC/AC:       1.24       1.09 -
 AC:      135.3   mm     G. Age:  19w 0d         45  %    FL/BPD:      65.3  %
 FL:       29.7   mm     G. Age:  19w 1d         49  %    FL/AC:       22.0  %    20 - 24
 HUM:      28.6   mm     G. Age:  19w 2d         57  %
 CER:      19.3   mm     G. Age:  18w 5d         40  %
 NFT:        3.3  mm

 LV:         7.1  mm
 CM:         4.7  mm

 Est. FW:     275   gm    0 lb 10 oz      53  %
OB History

 Gravidity:     1         Term:  0          Prem:  0        SAB:   0
 TOP:           0       Ectopic: 0         Living: 0
Gestational Age

 LMP:            19w 0d       Date:  07/04/19                   EDD:  04/09/20
 U/S Today:      19w 2d                                         EDD:  04/07/20
 Best:           19w 0d    Det. By:  LMP  (07/04/19)            EDD:  04/09/20
Anatomy

 Cranium:                Appears normal         LVOT:                   Appears normal
 Cavum:                  Appears normal         Aortic Arch:            Appears normal
 Ventricles:             Appears normal         Ductal Arch:            Appears normal
 Choroid Plexus:         Appears normal         Diaphragm:              Appears normal
 Cerebellum:             Appears normal         Stomach:                Appears normal, left
                                                                        sided
 Posterior Fossa:        Appears normal         Abdomen:                Appears normal
 Nuchal Fold:            Appears normal         Abdominal Wall:         Appears nml (cord
                                                                        insert, abd wall)
 Face:                   Appears normal         Cord Vessels:           Appears normal (3
                         (orbits and profile)                           vessel cord)
 Lips:                   Appears normal         Kidneys:                Appear normal
 Palate:                 Not well visualized    Bladder:                Appears normal
 Thoracic:               Appears normal         Spine:                  Appears normal
 Heart:                  Appears normal         Upper Extremities:      Appears normal
                         (4CH, axis, and situs)
 RVOT:                   Appears normal         Lower Extremities:      Appears normal

 Other:   Fetus appears to be a male. Heels and 5th digit visualized. Nasal bone
          visualized.
Cervix Uterus Adnexa

 Cervix
 Length:            3.88  cm.
 Normal appearance by transabdominal scan.

 Right Ovary
 Within normal limits.

 Left Ovary
 Not visualized.
 Adnexa
 No abnormality visualized.
Impression

 Single intrauterine pregnancy with measurements consistent
 with dates
 Normal anatomy with good fetal movement and amniotic fluid
 No anomalies were observed however, ultrasound may not
 detect all anomalies.
Recommendations

 Follow up as clinically indicated.

## 2023-01-27 LAB — OB RESULTS CONSOLE RUBELLA ANTIBODY, IGM: Rubella: NON-IMMUNE/NOT IMMUNE

## 2023-01-27 LAB — OB RESULTS CONSOLE RPR: RPR: NONREACTIVE

## 2023-01-27 LAB — OB RESULTS CONSOLE HIV ANTIBODY (ROUTINE TESTING): HIV: NONREACTIVE

## 2023-01-27 LAB — HEPATITIS C ANTIBODY: HCV Ab: NEGATIVE

## 2023-01-27 LAB — OB RESULTS CONSOLE HEPATITIS B SURFACE ANTIGEN: Hepatitis B Surface Ag: NEGATIVE

## 2023-01-27 LAB — OB RESULTS CONSOLE GC/CHLAMYDIA
Chlamydia: NEGATIVE
Neisseria Gonorrhea: NEGATIVE

## 2023-03-04 IMAGING — DX DG CHEST 1V PORT
1 series · 1 of 1 positions shown · non-contrast
Comparison: None.

CLINICAL DATA: Pt reports intermittent chest pain - states it
started few days ago and its more frequent today Pt states
possibility of pregnancy. Patient's abdomen shielded during chest
xr.

EXAM:
PORTABLE CHEST 1 VIEW

[chest]
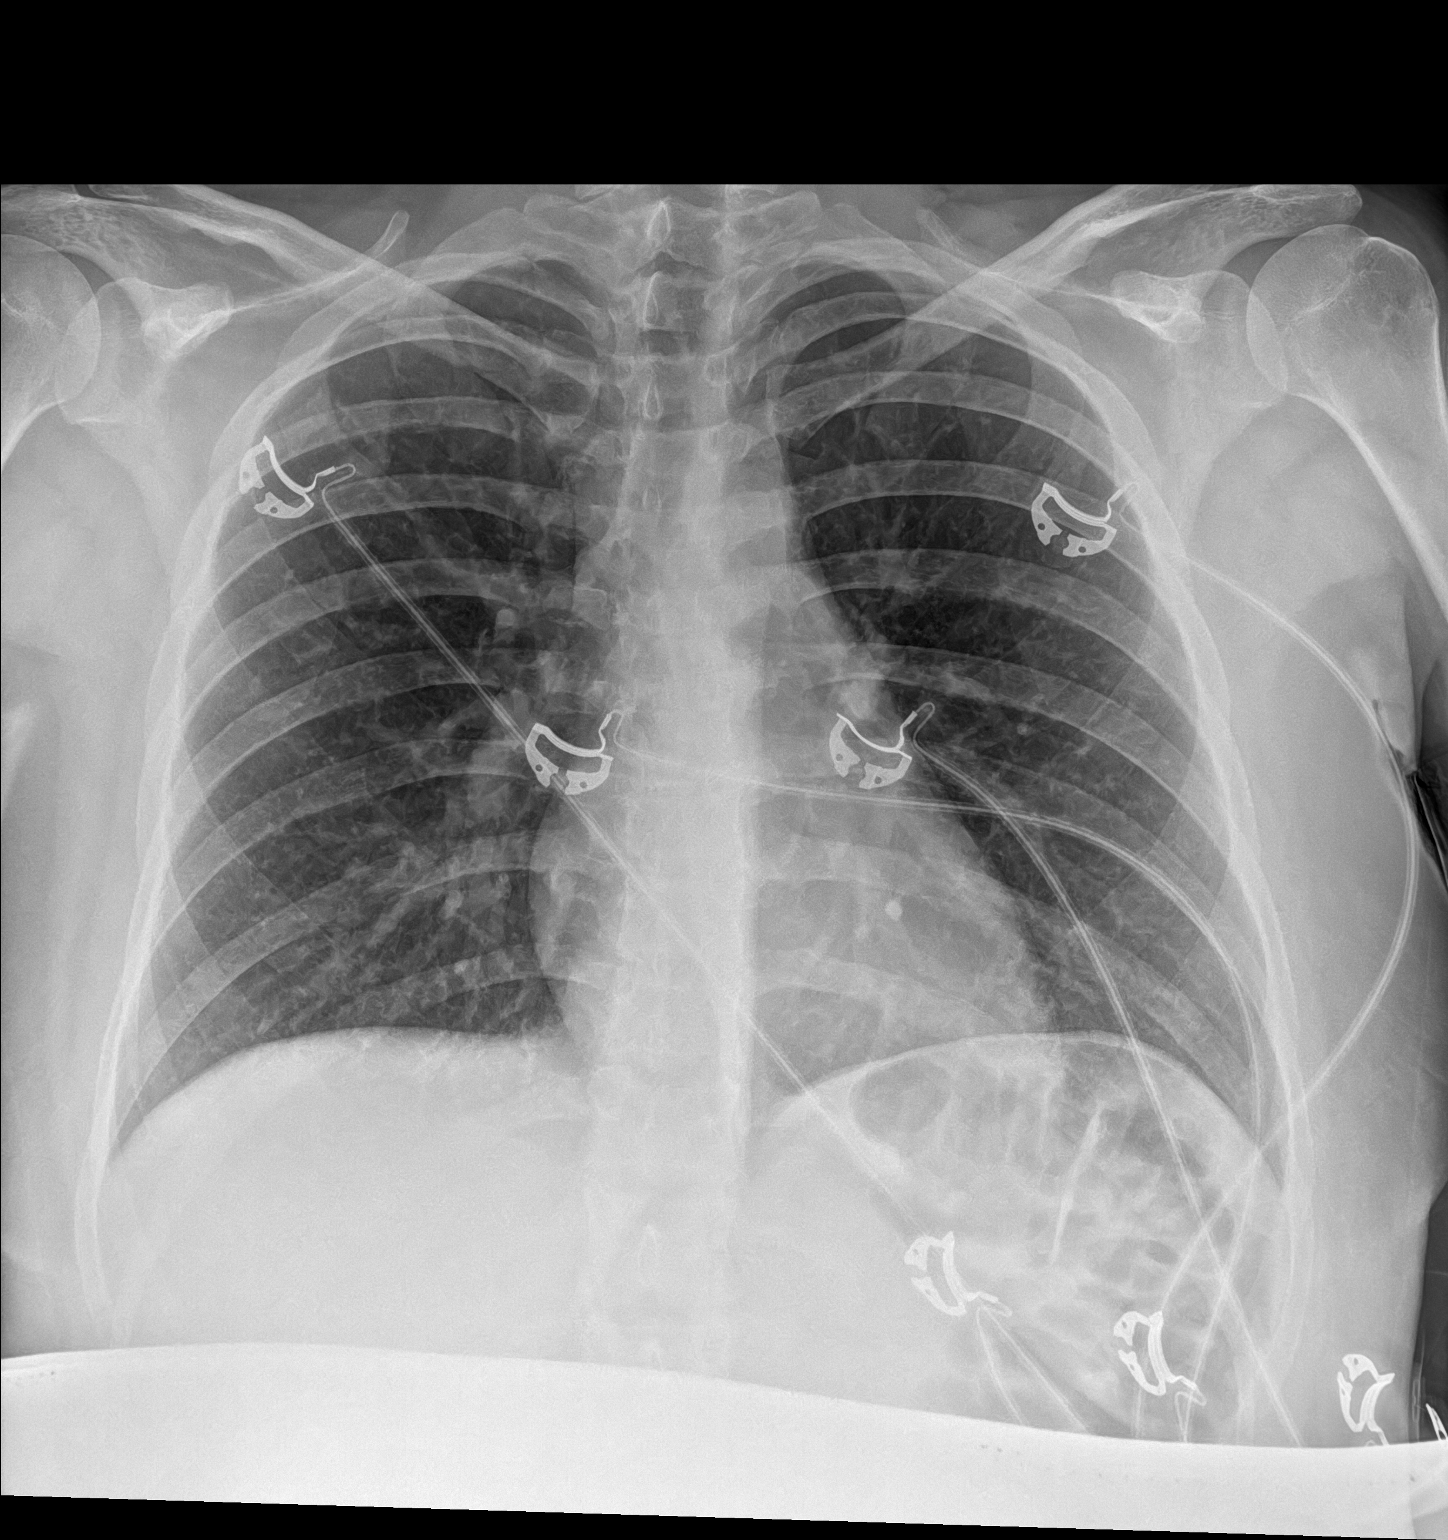

[1 of 1 positions shown; findings below may reference images not displayed]

FINDINGS: The heart size and mediastinal contours are within normal limits.

No focal consolidation. No pulmonary edema. No pleural effusion. No
pneumothorax.

No acute osseous abnormality.
IMPRESSION: No active disease.

## 2023-06-15 NOTE — L&D Delivery Note (Signed)
 Delivery Note    Patient Name: Tina Davila DOB: Oct 12, 1995 MRN: [id number]  Date of admission: 09/01/2023 Delivering MD: Dale Glenwood Date of delivery: 09/01/2023 Type of delivery: SVD  Newborn Data: Live born female  Birth Weight:   APGAR: 9, 9  Newborn Delivery   Birth date/time: 09/01/2023 15:55:26 Delivery type: Vaginal, Spontaneous    [name], 27 y.o., @ [redacted]w[redacted]d,  [id number], who was admitted for iol FOR postdates and gbs-. Progresed with foley bulb and pit and arom . I was called to the room when she progressed 2+ station in the second stage of labor.  She pushed for 5/min.  She delivered a viable infant, cephalic and restituted to the ROA with right compound hand position over an intact perineum.  A nuchal cord   was not identified. The baby was placed on maternal abdomen while initial step of NRP were perfmored (Dry, Stimulated, and warmed). Hat placed on baby for thermoregulation. Delayed cord clamping was performed for 2 minutes.  Cord double clamped and cut.  Cord cut by fob. Apgar scores were 9 and 9. Prophylactic Pitocin was started in the third stage of labor for active management. The placenta delivered spontaneously, shultz, with a 3 vessel cord and was sent to LD.  Inspection revealed none. An examination of the vaginal vault and cervix was free from lacerations. The uterus was firm, bleeding stable.   Placenta and umbilical artery blood gas were not sent.  There were no complications during the procedure.  Mom and baby skin to skin following delivery. Left in stable condition.  Maternal Info: Anesthesia: Epidural Episiotomy: no Lacerations:  abrasion with right labia and pernium  Suture Repair: no Est. Blood Loss (mL):   Newborn Info:  Baby Sex: female Circumcision: bo  APGAR (1 MIN): 9  APGAR (5 MINS): 9  APGAR (10 MINS):     Mom to postpartum.  Baby to Couplet care / Skin to Skin.  Delivery Report:    Review the Delivery Report for  details.   [facility] CNM, FNP-C, PMHNP-BC  [address]  [place], [place] [address]  Cell: [phone]  Office Phone: [phone] Fax: [phone] 09/01/2023  4:31 PM

## 2023-07-29 LAB — OB RESULTS CONSOLE GBS: GBS: NEGATIVE

## 2023-08-24 ENCOUNTER — Other Ambulatory Visit: Payer: Self-pay | Admitting: Obstetrics and Gynecology

## 2023-08-25 ENCOUNTER — Telehealth (HOSPITAL_COMMUNITY): Payer: Self-pay | Admitting: *Deleted

## 2023-08-25 ENCOUNTER — Encounter (HOSPITAL_COMMUNITY): Payer: Self-pay | Admitting: *Deleted

## 2023-08-25 NOTE — Telephone Encounter (Signed)
 Preadmission screen

## 2023-09-01 ENCOUNTER — Encounter (HOSPITAL_COMMUNITY): Payer: Self-pay | Admitting: Obstetrics and Gynecology

## 2023-09-01 ENCOUNTER — Other Ambulatory Visit: Payer: Self-pay

## 2023-09-01 ENCOUNTER — Inpatient Hospital Stay (HOSPITAL_COMMUNITY)

## 2023-09-01 ENCOUNTER — Inpatient Hospital Stay (HOSPITAL_COMMUNITY): Admitting: Anesthesiology

## 2023-09-01 ENCOUNTER — Inpatient Hospital Stay (HOSPITAL_COMMUNITY)
Admission: RE | Admit: 2023-09-01 | Discharge: 2023-09-02 | DRG: 807 | Disposition: A | Discharge status: Home / Self Care | Attending: Obstetrics and Gynecology | Admitting: Obstetrics and Gynecology

## 2023-09-01 DIAGNOSIS — O9902 Anemia complicating childbirth: Secondary | ICD-10-CM | POA: Diagnosis present

## 2023-09-01 DIAGNOSIS — D509 Iron deficiency anemia, unspecified: Secondary | ICD-10-CM | POA: Diagnosis present

## 2023-09-01 DIAGNOSIS — O480 Post-term pregnancy: Principal | ICD-10-CM | POA: Diagnosis present

## 2023-09-01 DIAGNOSIS — Z3A41 41 weeks gestation of pregnancy: Secondary | ICD-10-CM | POA: Diagnosis not present

## 2023-09-01 DIAGNOSIS — Z3490 Encounter for supervision of normal pregnancy, unspecified, unspecified trimester: Principal | ICD-10-CM | POA: Diagnosis present

## 2023-09-01 LAB — CBC
HCT: 27.7 % — ABNORMAL LOW (ref 36.0–46.0)
Hemoglobin: 8.3 g/dL — ABNORMAL LOW (ref 12.0–15.0)
MCH: 20.1 pg — ABNORMAL LOW (ref 26.0–34.0)
MCHC: 30 g/dL (ref 30.0–36.0)
MCV: 67.2 fL — ABNORMAL LOW (ref 80.0–100.0)
Platelets: 375 10*3/uL (ref 150–400)
RBC: 4.12 MIL/uL (ref 3.87–5.11)
RDW: 17 % — ABNORMAL HIGH (ref 11.5–15.5)
WBC: 8.5 10*3/uL (ref 4.0–10.5)
nRBC: 0.2 % (ref 0.0–0.2)

## 2023-09-01 LAB — TYPE AND SCREEN
ABO/RH(D): B POS
Antibody Screen: NEGATIVE

## 2023-09-01 MED ORDER — FENTANYL-BUPIVACAINE-NACL 0.5-0.125-0.9 MG/250ML-% EP SOLN
12.0000 mL/h | EPIDURAL | Status: DC | PRN
  Administered 2023-09-01: 10 mL/h via EPIDURAL
  Filled 2023-09-01: qty 250

## 2023-09-01 MED ORDER — EPHEDRINE 5 MG/ML INJ: 10.0000 mg | INTRAVENOUS | Status: DC | PRN

## 2023-09-01 MED ORDER — LIDOCAINE HCL (PF) 1 % IJ SOLN
INTRAMUSCULAR | Status: DC | PRN
  Administered 2023-09-01: 2 mL via EPIDURAL
  Administered 2023-09-01: 4 mL via EPIDURAL

## 2023-09-01 MED ORDER — FENTANYL CITRATE (PF) 100 MCG/2ML IJ SOLN: 50.0000 ug | INTRAMUSCULAR | Status: DC | PRN

## 2023-09-01 MED ORDER — COCONUT OIL OIL
1.0000 | TOPICAL_OIL | Status: DC | PRN
  Administered 2023-09-02: 1 via TOPICAL

## 2023-09-01 MED ORDER — MISOPROSTOL 25 MCG QUARTER TABLET: 25.0000 ug | ORAL_TABLET | Freq: Once | ORAL | Status: DC

## 2023-09-01 MED ORDER — TERBUTALINE SULFATE 1 MG/ML IJ SOLN: 0.2500 mg | Freq: Once | INTRAMUSCULAR | Status: DC | PRN

## 2023-09-01 MED ORDER — TETANUS-DIPHTH-ACELL PERTUSSIS 5-2.5-18.5 LF-MCG/0.5 IM SUSY: 0.5000 mL | PREFILLED_SYRINGE | Freq: Once | INTRAMUSCULAR | Status: DC

## 2023-09-01 MED ORDER — LACTATED RINGERS IV SOLN: 500.0000 mL | Freq: Once | INTRAVENOUS | Status: DC

## 2023-09-01 MED ORDER — SOD CITRATE-CITRIC ACID 500-334 MG/5ML PO SOLN: 30.0000 mL | ORAL | Status: DC | PRN

## 2023-09-01 MED ORDER — ONDANSETRON HCL 4 MG PO TABS: 4.0000 mg | ORAL_TABLET | ORAL | Status: DC | PRN

## 2023-09-01 MED ORDER — BENZOCAINE-MENTHOL 20-0.5 % EX AERO: 1.0000 | INHALATION_SPRAY | CUTANEOUS | Status: DC | PRN

## 2023-09-01 MED ORDER — ONDANSETRON HCL 4 MG/2ML IJ SOLN: 4.0000 mg | Freq: Four times a day (QID) | INTRAMUSCULAR | Status: DC | PRN

## 2023-09-01 MED ORDER — DIPHENHYDRAMINE HCL 50 MG/ML IJ SOLN: 12.5000 mg | INTRAMUSCULAR | Status: DC | PRN

## 2023-09-01 MED ORDER — ZOLPIDEM TARTRATE 5 MG PO TABS: 5.0000 mg | ORAL_TABLET | Freq: Every evening | ORAL | Status: DC | PRN

## 2023-09-01 MED ORDER — OXYTOCIN-SODIUM CHLORIDE 30-0.9 UT/500ML-% IV SOLN
1.0000 m[IU]/min | INTRAVENOUS | Status: DC
  Administered 2023-09-01: 2 m[IU]/min via INTRAVENOUS

## 2023-09-01 MED ORDER — LIDOCAINE HCL (PF) 1 % IJ SOLN: 30.0000 mL | INTRAMUSCULAR | Status: DC | PRN

## 2023-09-01 MED ORDER — ACETAMINOPHEN 325 MG PO TABS: 650.0000 mg | ORAL_TABLET | ORAL | Status: DC | PRN

## 2023-09-01 MED ORDER — PHENYLEPHRINE 80 MCG/ML (10ML) SYRINGE FOR IV PUSH (FOR BLOOD PRESSURE SUPPORT): 80.0000 ug | PREFILLED_SYRINGE | INTRAVENOUS | Status: DC | PRN

## 2023-09-01 MED ORDER — ONDANSETRON HCL 4 MG/2ML IJ SOLN: 4.0000 mg | INTRAMUSCULAR | Status: DC | PRN

## 2023-09-01 MED ORDER — OXYCODONE-ACETAMINOPHEN 5-325 MG PO TABS: 1.0000 | ORAL_TABLET | ORAL | Status: DC | PRN

## 2023-09-01 MED ORDER — DIPHENHYDRAMINE HCL 25 MG PO CAPS: 25.0000 mg | ORAL_CAPSULE | Freq: Four times a day (QID) | ORAL | Status: DC | PRN

## 2023-09-01 MED ORDER — LACTATED RINGERS IV SOLN: 500.0000 mL | INTRAVENOUS | Status: DC | PRN

## 2023-09-01 MED ORDER — LACTATED RINGERS IV SOLN: INTRAVENOUS | Status: DC

## 2023-09-01 MED ORDER — SIMETHICONE 80 MG PO CHEW: 80.0000 mg | CHEWABLE_TABLET | ORAL | Status: DC | PRN

## 2023-09-01 MED ORDER — OXYCODONE-ACETAMINOPHEN 5-325 MG PO TABS: 2.0000 | ORAL_TABLET | ORAL | Status: DC | PRN

## 2023-09-01 MED ORDER — LACTATED RINGERS IV SOLN
500.0000 mL | Freq: Once | INTRAVENOUS | Status: DC
Start: 2023-09-01 — End: 2023-09-01

## 2023-09-01 MED ORDER — OXYTOCIN-SODIUM CHLORIDE 30-0.9 UT/500ML-% IV SOLN
2.5000 [IU]/h | INTRAVENOUS | Status: DC
  Filled 2023-09-01: qty 500

## 2023-09-01 MED ORDER — PRENATAL MULTIVITAMIN CH
1.0000 | ORAL_TABLET | Freq: Every day | ORAL | Status: DC
  Administered 2023-09-02: 1 via ORAL
  Filled 2023-09-01: qty 1

## 2023-09-01 MED ORDER — SENNOSIDES-DOCUSATE SODIUM 8.6-50 MG PO TABS
2.0000 | ORAL_TABLET | Freq: Every day | ORAL | Status: DC
  Administered 2023-09-02: 2 via ORAL
  Filled 2023-09-01: qty 2

## 2023-09-01 MED ORDER — DIBUCAINE (PERIANAL) 1 % EX OINT: 1.0000 | TOPICAL_OINTMENT | CUTANEOUS | Status: DC | PRN

## 2023-09-01 MED ORDER — WITCH HAZEL-GLYCERIN EX PADS: 1.0000 | MEDICATED_PAD | CUTANEOUS | Status: DC | PRN

## 2023-09-01 MED ORDER — IBUPROFEN 600 MG PO TABS
600.0000 mg | ORAL_TABLET | Freq: Four times a day (QID) | ORAL | Status: DC
  Administered 2023-09-01 – 2023-09-02 (×3): 600 mg via ORAL
  Filled 2023-09-01 (×3): qty 1

## 2023-09-01 MED ORDER — OXYTOCIN BOLUS FROM INFUSION
333.0000 mL | Freq: Once | INTRAVENOUS | Status: AC
  Administered 2023-09-01: 333 mL via INTRAVENOUS

## 2023-09-01 NOTE — Lactation Note (Signed)
 This note was copied from a baby's chart. Lactation Consultation Note  Patient Name: Tina Davila [id number] Date: 09/01/2023 Age:26 hours Reason for consult: Initial assessment;Term  P3- MOB reports that infant is nursing better than any of her children so far. MOB denies having any questions or concerns at this time. MOB informed LC that a few days before delivery, MOB had pumped 2 ounces of EBM from her right breast only. MOB reports this is the most colostrum she has had between all 3 of her children. LC praised MOB for her supply. LC reviewed the first 24 hr birthday nap, day 2 cluster feeding, feeding infant on cue 8-12x in 24 hrs, not allowing infant to go over 3 hrs without a feeding, CDC milk storage guidelines, LC services handout and engorgement/breast care. LC encouraged MOB to call for further assistance as needed. MOB requested a discharge at 24 hrs. LC informed RN of MOB's request.  Maternal Data Has patient been taught Hand Expression?: Yes Does the patient have breastfeeding experience prior to this delivery?: Yes How long did the patient breastfeed?: 6 months with first child and a few days with second child  Feeding Mother's Current Feeding Choice: Breast Milk  Lactation Tools Discussed/Used Pump Education: Milk Storage  Interventions Interventions: Breast feeding basics reviewed;Education;LC Services brochure  Discharge Discharge Education: Engorgement and breast care;Warning signs for feeding baby Pump: DEBP;Manual;Hands Free;Personal  Consult Status Consult Status: Follow-up Date: 09/02/23 Follow-up type: In-patient    Dema Severin BS, IBCLC 09/01/2023, 9:16 PM

## 2023-09-01 NOTE — Anesthesia Procedure Notes (Signed)
 Epidural Patient location during procedure: OB Start time: 09/01/2023 2:15 PM End time: 09/01/2023 2:17 PM  Staffing Anesthesiologist: Beryle Lathe, MD Performed: anesthesiologist   Preanesthetic Checklist Completed: patient identified, IV checked, risks and benefits discussed, monitors and equipment checked, pre-op evaluation and timeout performed  Epidural Patient position: sitting Prep: DuraPrep Patient monitoring: continuous pulse ox and blood pressure Approach: midline Location: L2-L3 Injection technique: LOR saline  Needle:  Needle type: Tuohy  Needle gauge: 17 G Needle length: 9 cm Needle insertion depth: 5 cm Catheter size: 19 Gauge Catheter at skin depth: 10 cm Test dose: negative and Other (1% lidocaine)  Assessment Events: blood not aspirated and no cerebrospinal fluid  Additional Notes Patient identified. Risks including, but not limited to, bleeding, infection, nerve damage, paralysis, inadequate analgesia, blood pressure changes, nausea, vomiting, allergic reaction, postpartum back pain, itching, and headache were discussed. Patient expressed understanding and wished to proceed. Sterile prep and drape, including hand hygiene, mask, and sterile gloves were used. The patient was positioned and the spine was prepped. The skin was anesthetized with lidocaine. No paraesthesia or other complication noted. The patient did not experience any signs of intravascular injection such as tinnitus or metallic taste in mouth, nor signs of intrathecal spread such as rapid motor block. Please see nursing notes for vital signs. The patient tolerated the procedure well.   Leslye Peer, MDReason for block:procedure for pain

## 2023-09-01 NOTE — Progress Notes (Signed)
   Labor Progress Note  Tina Davila is a 28 y.o. female, G3P2002, IUP at 41 weeks, presenting for IOL for postdates. Pt endorse + Fm. H/O anemia, anxiety no meds and rubella non immune   Subjective: Pt stable and comfortable in bed post epidural,l , still slight lower left abdominal pain, pt placed on left side with peanuts and pushed epidural button, pt tolerated foley cath placement, and excited to have a baby with support at bedside.  Patient Active Problem List   Diagnosis Date Noted   Encounter for induction of labor 09/01/2023   Normal labor and delivery 08/14/2021   SVD (spontaneous vaginal delivery) 04/10/2020   Normal postpartum course 04/10/2020   Normal labor 04/09/2020   Rubella non-immune status, antepartum 04/09/2020   Anxiety 04/09/2020   Objective: BP 108/69   Pulse 73   Temp 98.2 F (36.8 C) (Oral)   Resp 18   Ht 4\' 10"  (1.473 m)   Wt 61.1 kg   BMI 28.17 kg/m  No intake/output data recorded. No intake/output data recorded. NST: FHR baseline 145 bpm, Variability: moderate, Accelerations:present, Decelerations:  Absent= Cat 1/Reactive CTX:  regular, every 2-3 minutes Uterus gravid, soft non tender, moderate to palpate with contractions.  SVE:  Dilation: 5.5 Effacement (%): 80 Station: -1 Exam by:: [employer], cbm Pitocin at 43mUn/min  Assessment:  Tina Davila is a 28 y.o. female, G3P2002, IUP at 41 weeks, presenting for IOL for postdates. Pt endorse + Fm. H/O anemia, anxiety no meds and rubella non immune . Progressing in latent labor  Patient Active Problem List   Diagnosis Date Noted   Encounter for induction of labor 09/01/2023   Normal labor and delivery 08/14/2021   SVD (spontaneous vaginal delivery) 04/10/2020   Normal postpartum course 04/10/2020   Normal labor 04/09/2020   Rubella non-immune status, antepartum 04/09/2020   Anxiety 04/09/2020   NICHD: Category 1  Membranes:  AROM, clear moderate, 3/20, no s/s of  infection  Induction:    Cytotec xN/A  Foley Bulb: inserted  @ 1045am out at 1327pm 3/20  Pitocin - 6  Pain management:               IV pain management: x PRN  Nitrous: PRN             Epidural placement:  at 1423 on 3/20  GBS Negative  Plan: Continue labor plan Continuous/intermittent monitoring Rest Ambulate Frequent position changes to facilitate fetal rotation and descent. Will reassess with cervical exam at 4 hours or earlier if necessary Continue pitocin per protocol Anticipate labor progression and vaginal delivery. Rubella Non-Immune: offer vaccine [address], FNP-C, PMHNP-BC  [address]  [place], [place] [address]  Cell: [phone]  Office Phone: [phone] Fax: [phone] 09/01/2023  3:07 PM

## 2023-09-01 NOTE — H&P (Signed)
 Tina Davila is a 28 y.o. female, G3P2002, IUP at 41 weeks, presenting for IOL for postdates. Pt endorse + Fm. Denies vaginal leakage. Denies vaginal bleeding. Denies feeling cxt's. H/O anemia, anxiety no meds and rubella non immune   Patient Active Problem List   Diagnosis Date Noted   Encounter for induction of labor 09/01/2023   Normal labor and delivery 08/14/2021   SVD (spontaneous vaginal delivery) 04/10/2020   Normal postpartum course 04/10/2020   Normal labor 04/09/2020   Rubella non-immune status, antepartum 04/09/2020   Anxiety 04/09/2020     Active Ambulatory Problems    Diagnosis Date Noted   Normal labor 04/09/2020   Rubella non-immune status, antepartum 04/09/2020   Anxiety 04/09/2020   SVD (spontaneous vaginal delivery) 04/10/2020   Normal postpartum course 04/10/2020   Normal labor and delivery 08/14/2021   Resolved Ambulatory Problems    Diagnosis Date Noted   No Resolved Ambulatory Problems   Past Medical History:  Diagnosis Date   Medical history non-contributory       Medications Prior to Admission  Medication Sig Dispense Refill Last Dose/Taking   dicyclomine (BENTYL) 20 MG tablet Take 1 tablet (20 mg total) by mouth 2 (two) times daily. 20 tablet 0    ibuprofen (ADVIL) 600 MG tablet Take 1 tablet (600 mg total) by mouth every 6 (six) hours as needed. 30 tablet 0    ondansetron (ZOFRAN-ODT) 4 MG disintegrating tablet Take 1 tablet (4 mg total) by mouth every 8 (eight) hours as needed for nausea or vomiting. 20 tablet 0     Past Medical History:  Diagnosis Date   Medical history non-contributory      No current facility-administered medications on file prior to encounter.   Current Outpatient Medications on File Prior to Encounter  Medication Sig Dispense Refill   dicyclomine (BENTYL) 20 MG tablet Take 1 tablet (20 mg total) by mouth 2 (two) times daily. 20 tablet 0   ibuprofen (ADVIL) 600 MG tablet Take 1 tablet (600 mg total) by  mouth every 6 (six) hours as needed. 30 tablet 0   ondansetron (ZOFRAN-ODT) 4 MG disintegrating tablet Take 1 tablet (4 mg total) by mouth every 8 (eight) hours as needed for nausea or vomiting. 20 tablet 0     No Known Allergies  History of present pregnancy: Pt Info/Preference:  Screening/Consents:  Labs:   EDD: Estimated Date of Delivery: 08/25/23  Establised: No LMP recorded. Patient is pregnant.  Anatomy Scan: Date: 04/26/2023 Placenta Location: anterior Genetic Screen: Panoroma:LR AFP:  First Tri: Quad:  Office: [facility]            First PNV: 10.2 weeks Blood Type --/--/PENDING (03/20 1057)  Language: english Last PNV: 40.5 weeks Rhogam    Flu Vaccine:  ???   Antibody PENDING (03/20 1057)  TDaP vaccine ???   GTT: Early: 5.6 Third Trimester: 80  Feeding Plan: breast BTL: no Rubella: Nonimmune (08/15 0000)  Contraception: ??? VBAC: no RPR: Nonreactive (08/15 0000)   Circumcision: ???   HBsAg: Negative (08/15 0000)  Pediatrician:  ???   HIV: Non-reactive (08/15 0000)   Prenatal Classes: no Additional [place]: 40.5 weeks 8.8lbs 49 % GBS: Negative/-- (02/14 0000)(For PCN allergy, check sensitivities)       Chlamydia: neg    MFM Referral/Consult:  GC: neg  Support Person: partner   PAP: ???  Pain Management: epidural Neonatologist Referral:  Hgb Electrophoresis:  AA  Birth Plan: DCC   Hgb NOB: 12.6  28W: 9.1   OB History     Gravida  3   Para  2   Term  2   Preterm      AB      Living  2      SAB      IAB      Ectopic      Multiple  0   Live Births  2          Past Medical History:  Diagnosis Date   Medical history non-contributory    Past Surgical History:  Procedure Laterality Date   NO PAST SURGERIES     Family History: family history is not on file. Social History:  reports that she has never smoked. She has never used smokeless tobacco. She reports that she does not drink alcohol and does not use drugs.   Prenatal Transfer Tool  Maternal Diabetes:  No Genetic Screening: Normal Maternal Ultrasounds/Referrals: Normal Fetal Ultrasounds or other Referrals:  None Maternal Substance Abuse:  No Significant Maternal Medications:  None Significant Maternal Lab Results: Group B Strep negative  ROS:  Review of Systems  Constitutional: Negative.   HENT: Negative.    Eyes: Negative.   Respiratory: Negative.    Cardiovascular: Negative.   Gastrointestinal: Negative.   Genitourinary: Negative.   Musculoskeletal: Negative.   Skin: Negative.   Neurological: Negative.   Endo/Heme/Allergies: Negative.   Psychiatric/Behavioral: Negative.       Physical Exam: BP 130/83 (BP Location: Right Arm)   Pulse (!) 108   Temp 98.2 F (36.8 C) (Oral)   Resp 18   Ht 4\' 10"  (1.473 m)   Wt 61.1 kg   BMI 28.17 kg/m   Physical Exam Vitals and nursing note reviewed.  Constitutional:      Appearance: Normal appearance.  HENT:     Head: Normocephalic and atraumatic.     Nose: Nose normal.     Mouth/Throat:     Mouth: Mucous membranes are moist.  Eyes:     Conjunctiva/sclera: Conjunctivae normal.  Cardiovascular:     Rate and Rhythm: Normal rate.     Pulses: Normal pulses.     Heart sounds: Normal heart sounds.  Pulmonary:     Effort: Pulmonary effort is normal.     Breath sounds: Normal breath sounds.  Abdominal:     General: Bowel sounds are normal.  Genitourinary:    Comments: Pelvis adequate, uterus gravida  Musculoskeletal:        General: Normal range of motion.     Cervical back: Normal range of motion.  Skin:    General: Skin is warm.     Capillary Refill: Capillary refill takes less than 2 seconds.  Neurological:     General: No focal deficit present.     Mental Status: She is alert.  Psychiatric:        Mood and Affect: Mood normal.      NST: FHR baseline 150 bpm, Variability: moderate, Accelerations:present, Decelerations:  Absent= Cat 1/Reactive UC:   irregular, every 1 in 10 minutes SVE:   Dilation: 2 Effacement  (%): 50 Station: -3 Exam by:: H.Price, RN, vertex verified by fetal sutures.  Leopold's: Position vertex, EFW 7lbs via leopold's.  Pelvis proven 7.2lbs Foley bulb placed with ease, instilled, pt tolerated well.   Labs: Results for orders placed or performed during the hospital encounter of 09/01/23 (from the past 24 hours)  Type and screen [facility]     Status:  None (Preliminary result)   Collection Time: 09/01/23 10:57 AM  Result Value Ref Range   ABO/RH(D) PENDING    Antibody Screen PENDING    Sample Expiration      09/04/2023,2359 Performed at [facility], [address], [place], [place] [address]     Imaging:  No results found.  MAU Course: Orders Placed This Encounter  Procedures   CBC   RPR   Diet clear liquid Room service appropriate? Yes; Fluid consistency: Thin   Vitals signs per unit policy   Notify physician (specify)   Fetal monitoring per unit policy   Activity as tolerated   Cervical Exam   Measure blood pressure post delivery every 15 min x 1 hour then every 30 min x 1 hour   Fundal check post delivery every 15 min x 1 hour then every 30 min x 1 hour   Apply Labor & Delivery Care Plan   If Rapid HIV test positive or known HIV positive: initiate AZT orders   May in and out cath x 2 for inability to void   Insert urethral catheter X 1 PRN If Coude Catheter is chosen, qualified resources by campus can be found in the clinical skills nursing procedure for Coude Catheter 1. If straight catheterized > 2 times or patient unable to void post epidural plac...   Refer to Sidebar Report Urinary (Foley) Catheter Indications   Refer to Sidebar Report Post Indwelling Urinary Catheter Removal and Intervention Guidelines   Discontinue foley prior to vaginal delivery   Initiate Oral Care Protocol   Initiate Carrier Fluid Protocol   Patient may have epidural placement upon request   Evaluate fetal heart rate to establish reassuring pattern  prior to initiating Cytotec or Pitocin   Perform a cervical exam prior to initiating Cytotec or Pitocin   Discontinue Pitocin if tachysystole with non-reassuring FHR is present   Notify physician (specify) Tachysystole is defined as more than 5 contractions in a 10-minute time period averaged over a 30-minute window   Initiate intrauterine resuscitation if tachysystole with non-reassuring FHR is present   Notify physician (specify) Tachysystole is defined as more than 5 contractions in a 10-minute time period averaged over a 30-minute window   May administer Terbutaline 0.25 mg SQ x 1 dose if tachysystole with non-reassuring FHR is present   Labor Induction   Evaluate fetal heart rate to establish reassuring pattern prior to initiating Cytotec or Pitocin   Perform a cervical exam prior to initiating Cytotec or Pitocin   Discontinue Pitocin if tachysystole with non-reassuring FHR is present   Notify physician (specify) Tachysystole is defined as more than 5 contractions in a 10-minute time period averaged over a 30-minute window   Initiate intrauterine resuscitation if tachysystole with non-reassuring FHR is present   Notify physician (specify) Tachysystole is defined as more than 5 contractions in a 10-minute time period averaged over a 30-minute window   May administer Terbutaline 0.25 mg SQ x 1 dose if tachysystole with non-reassuring FHR is present   Labor Induction   Full code   Type and screen [facility]   Insert and maintain IV Line   Admit to Inpatient (patient's expected length of stay will be greater than 2 midnights or inpatient only procedure)   Meds ordered this encounter  Medications   lactated ringers infusion   oxytocin (PITOCIN) IV BOLUS FROM BAG   oxytocin (PITOCIN) IV infusion 30 units in NS 500 mL - Premix  lactated ringers infusion 500-1,000 mL   acetaminophen (TYLENOL) tablet 650 mg   oxyCODONE-acetaminophen (PERCOCET/ROXICET) 5-325 MG per tablet 1  tablet    Refill:  0   oxyCODONE-acetaminophen (PERCOCET/ROXICET) 5-325 MG per tablet 2 tablet    Refill:  0   ondansetron (ZOFRAN) injection 4 mg   sodium citrate-citric acid (ORACIT) solution 30 mL   lidocaine (PF) (XYLOCAINE) 1 % injection 30 mL   fentaNYL (SUBLIMAZE) injection 50-100 mcg   terbutaline (BRETHINE) injection 0.25 mg   DISCONTD: misoprostol (CYTOTEC) tablet 25 mcg   DISCONTD: misoprostol (CYTOTEC) tablet 25 mcg   terbutaline (BRETHINE) injection 0.25 mg   oxytocin (PITOCIN) IV infusion 30 units in NS 500 mL - Premix    Begin infusion at::   2 milli-units/min (2 mL/hr)    Increase infusion by::   2 milli-units/min (2 mL/hr)    Assessment/Plan:  Lexa Coronado is a 28 y.o. female, G3P2002, IUP at 41 weeks, presenting for IOL for postdates. Pt endorse + Fm. Denies vaginal leakage. Denies vaginal bleeding. Denies feeling cxt's. H/O anemia, anxiety no meds and rubella non immune    FWB: Cat 1 Fetal Tracing.   Plan: Admit to Birthing Suite per consult with Dr Normand Sloop Routine [facility] orders Pain med/epidural prn Foley bulb and pitocin Anticipate labor progression  Rubella Non-Immune: Offer PP vaccine.   [facility] CNM, FNP-C, PMHNP-BC  [address]  [place], [place] [address]  Cell: [phone]  Office Phone: [phone] Fax: [phone] 09/01/2023  11:36 AM

## 2023-09-01 NOTE — Anesthesia Preprocedure Evaluation (Signed)
 Anesthesia Evaluation  Patient identified by MRN, date of birth, ID band Patient awake    Reviewed: Allergy & Precautions, NPO status , Patient's Chart, lab work & pertinent test results  History of Anesthesia Complications Negative for: history of anesthetic complications  Airway Mallampati: II   Neck ROM: Full    Dental   Pulmonary neg pulmonary ROS   Pulmonary exam normal        Cardiovascular negative cardio ROS Normal cardiovascular exam     Neuro/Psych  PSYCHIATRIC DISORDERS Anxiety     negative neurological ROS     GI/Hepatic negative GI ROS, Neg liver ROS,,,  Endo/Other  negative endocrine ROS    Renal/GU negative Renal ROS     Musculoskeletal negative musculoskeletal ROS (+)    Abdominal   Peds  Hematology  (+) Blood dyscrasia, anemia  Plt 375k    Anesthesia Other Findings   Reproductive/Obstetrics (+) Pregnancy                             Anesthesia Physical Anesthesia Plan  ASA: 2  Anesthesia Plan: Epidural   Post-op Pain Management:    Induction:   PONV Risk Score and Plan: 2 and Treatment may vary due to age or medical condition  Airway Management Planned: Natural Airway  Additional Equipment: None  Intra-op Plan:   Post-operative Plan:   Informed Consent: I have reviewed the patients History and Physical, chart, labs and discussed the procedure including the risks, benefits and alternatives for the proposed anesthesia with the patient or authorized representative who has indicated his/her understanding and acceptance.       Plan Discussed with: CRNA and Anesthesiologist  Anesthesia Plan Comments: (Labs reviewed. Platelets acceptable, patient not taking any blood thinning medications. Per RN, FHR tracing reported to be stable enough for sitting procedure. Risks and benefits discussed with patient, including PDPH, backache, epidural hematoma, failed  epidural, blood pressure changes, allergic reaction, and nerve injury. Patient expressed understanding and wished to proceed.)       Anesthesia Quick Evaluation

## 2023-09-02 LAB — CBC
HCT: 25.3 % — ABNORMAL LOW (ref 36.0–46.0)
Hemoglobin: 7.6 g/dL — ABNORMAL LOW (ref 12.0–15.0)
MCH: 20.1 pg — ABNORMAL LOW (ref 26.0–34.0)
MCHC: 30 g/dL (ref 30.0–36.0)
MCV: 66.8 fL — ABNORMAL LOW (ref 80.0–100.0)
Platelets: 314 10*3/uL (ref 150–400)
RBC: 3.79 MIL/uL — ABNORMAL LOW (ref 3.87–5.11)
RDW: 17 % — ABNORMAL HIGH (ref 11.5–15.5)
WBC: 12.1 10*3/uL — ABNORMAL HIGH (ref 4.0–10.5)
nRBC: 0.2 % (ref 0.0–0.2)

## 2023-09-02 LAB — RPR: RPR Ser Ql: NONREACTIVE

## 2023-09-02 MED ORDER — ACETAMINOPHEN 325 MG PO TABS: 650.0000 mg | ORAL_TABLET | Freq: Four times a day (QID) | ORAL | Status: AC | PRN

## 2023-09-02 MED ORDER — MEASLES, MUMPS & RUBELLA VAC IJ SOLR: 0.5000 mL | Freq: Once | INTRAMUSCULAR | Status: DC

## 2023-09-02 MED ORDER — IBUPROFEN 600 MG PO TABS: 600.0000 mg | ORAL_TABLET | Freq: Four times a day (QID) | ORAL | 0 refills | Status: DC

## 2023-09-02 NOTE — Progress Notes (Signed)
 MOB was referred for history of anxiety.  * Referral screened out by Clinical Social Worker because none of the following criteria appear to apply:  ~ History of anxiety during this pregnancy, or of post-partum depression following prior delivery.  ~ Diagnosis of anxiety within last 3 years Per OB notes, MOB did not indicate any signs/symptoms during her pregnancy.  OR  * MOB's symptoms currently being treated with medication and/or therapy.  Please contact the Clinical Social Worker if needs arise, by [facility] request, or if MOB scores greater than 9/yes to question 10 on Edinburgh Postpartum Depression Screen.  Enos Fling, Theresia Majors Clinical Social Worker [phone]

## 2023-09-02 NOTE — Anesthesia Postprocedure Evaluation (Signed)
 Anesthesia Post Note  Patient: Tina Davila  Procedure(s) Performed: AN AD HOC LABOR EPIDURAL     Patient location during evaluation: Mother Baby Anesthesia Type: Epidural Level of consciousness: awake Pain management: satisfactory to patient Vital Signs Assessment: post-procedure vital signs reviewed and stable Respiratory status: spontaneous breathing Cardiovascular status: stable Anesthetic complications: no  No notable events documented.  Last Vitals:  Vitals:   09/02/23 0317 09/02/23 0638  BP: (!) 98/53 101/66  Pulse: 65 67  Resp: 18 18  Temp: 36.8 C 36.7 C  SpO2: 100% 100%    Last Pain:  Vitals:   09/02/23 0803  TempSrc:   PainSc: 0-No pain   Pain Goal:                   Cephus Shelling

## 2023-09-02 NOTE — Progress Notes (Signed)
Patient declined MMR vaccine.

## 2023-09-02 NOTE — Lactation Note (Signed)
 This note was copied from a baby's chart. Lactation Consultation Note  Patient Name: Tina Davila EAVWU'[id number] Date: 09/02/2023 Age:26 hours Reason for consult: Maternal discharge;Term  P3, 41 wks, @ 25 hrs of life. Encouraged mom to keep working on big mouth latch with baby and use EBM or coconut oil after each feed. Discussed cluster feeding overnight/ early morning brings in our milk supply, shared expectations of milk coming in. Highlighted risk of engorgement. Discussed hand pump/express to soften breasts, motrin as anti-inflammatory, and ice packs for 10-20 minutes post feed/pumping if still over-full is the best treatments for inflamed/engorged breasts.  Maternal Data    Feeding Mother's Current Feeding Choice: Breast Milk  LATCH Score   Lactation Tools Discussed/Used Pump Education: Milk Storage  Interventions Interventions: Breast feeding basics reviewed;Hand express;Breast compression;Expressed milk;Hand pump;Education;CDC milk storage guidelines;LC Services brochure  Discharge Discharge Education: Engorgement and breast care Pump: Manual;Personal (Per mom has an electric pump and a hand pump @ home- highlighted hand pump may be best tool to soften engorged breasts)  Consult Status Consult Status: Complete Date: 09/02/23    [name] 09/02/2023, 5:07 PM

## 2023-09-02 NOTE — Discharge Summary (Signed)
 Postpartum Discharge Summary  Date of Service updated 09/02/23    Patient Name: Tina Davila DOB: January 07, 1996 MRN: [id number]  Date of admission: 09/01/2023 Delivery date:09/01/2023 Delivering provider: Dale Contra Costa Date of discharge: 09/02/2023  Admitting diagnosis: Encounter for induction of labor [Z34.90] Intrauterine pregnancy: [redacted]w[redacted]d     Secondary diagnosis:  Principal Problem:   Encounter for induction of labor Active Problems:   SVD (spontaneous vaginal delivery)   Normal postpartum course   IDA (iron deficiency anemia)  Additional problems: Rubella non-immune    Discharge diagnosis: Term Pregnancy Delivered                                              Post partum procedures: none Augmentation: AROM, Pitocin, and IP Foley Complications: None  Hospital course: Induction of Labor With Vaginal Delivery   28 y.o. yo G3P3003 at [redacted]w[redacted]d was admitted to the hospital 09/01/2023 for induction of labor.  Indication for induction: Postdates and Favorable cervix at term.  Patient had an uncomplicated labor course. Membrane Rupture Time/Date: 1:39 PM,09/01/2023  Delivery Method:Vaginal, Spontaneous Operative Delivery:N/A Episiotomy: None Lacerations:  Labial;Perineal Details of delivery can be found in separate delivery note.  Patient had an uncomplicated postpartum course. Patient is discharged home 09/02/23.  Newborn Data: Birth date:09/01/2023 Birth time:3:55 PM Gender:Female Living status:Living Apgars:9 ,9  Weight:3300 g  Magnesium Sulfate received: No BMZ received: No Rhophylac:N/A MMR:No, rubella non-immune, ppt declined vaccine postpartum T-DaP: declined Flu: No RSV Vaccine received: No Transfusion:No Immunizations administered: Immunization History  Administered Date(s) Administered   MMR 08/15/2021    Physical exam  Vitals:   09/01/23 1850 09/01/23 2304 09/02/23 0317 09/02/23 0638  BP: 114/74 105/66 (!) 98/53 101/66  Pulse: 69 68 65 67  Resp: 18  18 18 18   Temp: 98.5 F (36.9 C) 98.3 F (36.8 C) 98.2 F (36.8 C) 98 F (36.7 C)  TempSrc: Oral Oral Oral Oral  SpO2: 100% 100% 100% 100%  Weight:      Height:       General: alert, cooperative, and no distress Lochia: appropriate Uterine Fundus: firm Incision: N/A DVT Evaluation: No evidence of DVT seen on physical exam. No cords or calf tenderness. No significant calf/ankle edema. Labs: Lab Results  Component Value Date   WBC 12.1 (H) 09/02/2023   HGB 7.6 (L) 09/02/2023   HCT 25.3 (L) 09/02/2023   MCV 66.8 (L) 09/02/2023   PLT 314 09/02/2023      Latest Ref Rng & Units 09/30/2021   12:53 PM  CMP  Glucose 70 - 99 mg/dL 97   BUN 6 - 20 mg/dL 10   Creatinine 1.54 - 1.00 mg/dL 0.08   Sodium 676 - 195 mmol/L 139   Potassium 3.5 - 5.1 mmol/L 3.9   Chloride 98 - 111 mmol/L 105   CO2 22 - 32 mmol/L 24   Calcium 8.9 - 10.3 mg/dL 9.6   Total Protein 6.5 - 8.1 g/dL 7.7   Total Bilirubin 0.3 - 1.2 mg/dL 0.5   Alkaline Phos 38 - 126 U/L 80   AST 15 - 41 U/L 35   ALT 0 - 44 U/L 44    Edinburgh Score:    09/02/2023   10:25 AM  Edinburgh Postnatal Depression Scale Screening Tool  I have been able to laugh and see the funny side of things. 0  I  have looked forward with enjoyment to things. 0  I have blamed myself unnecessarily when things went wrong. 0  I have been anxious or worried for no good reason. 0  I have felt scared or panicky for no good reason. 0  Things have been getting on top of me. 0  I have been so unhappy that I have had difficulty sleeping. 0  I have felt sad or miserable. 0  I have been so unhappy that I have been crying. 0  The thought of harming myself has occurred to me. 0  Edinburgh Postnatal Depression Scale Total 0      After visit meds:  Allergies as of 09/02/2023   No Known Allergies      Medication List     STOP taking these medications    dicyclomine 20 MG tablet Commonly known as: BENTYL   ondansetron 4 MG disintegrating  tablet Commonly known as: ZOFRAN-ODT       TAKE these medications    acetaminophen 325 MG tablet Commonly known as: Tylenol Take 2 tablets (650 mg total) by mouth every 6 (six) hours as needed for moderate pain (pain score 4-6), fever or mild pain (pain score 1-3) (for pain scale < 4).   ibuprofen 600 MG tablet Commonly known as: ADVIL Take 1 tablet (600 mg total) by mouth every 6 (six) hours. What changed:  when to take this reasons to take this         Discharge home in stable condition Infant Feeding: Breast Infant Disposition:home with mother Discharge instruction: per After Visit Summary and Postpartum booklet. Activity: Advance as tolerated. Pelvic rest for 6 weeks.  Diet: routine diet Anticipated Birth Control:  considering vasectomy Postpartum Appointment:6 weeks Additional Postpartum F/U:  none Future Appointments:No future appointments. Follow up Visit:  Follow-up Information     [facility] Follow up.   Specialty: Obstetrics and Gynecology Contact information: [address] [place] [address] [phone]                    09/02/2023 Roma Schanz, CNM

## 2023-09-02 NOTE — Progress Notes (Deleted)
 Nurse called to RM 506. Pt says she felt a "gush". RN assesses PT's lochia and finds it normal with firm uterus . Pt reassured

## 2023-09-03 LAB — BIRTH TISSUE RECOVERY COLLECTION (PLACENTA DONATION)

## 2023-09-13 ENCOUNTER — Telehealth (HOSPITAL_COMMUNITY): Payer: Self-pay

## 2023-09-13 NOTE — Telephone Encounter (Signed)
 09/13/2023 1644  Name: Tina Davila MRN: [id number] DOB: 10/07/1995  Reason for Call:  Transition of Care Hospital Discharge Call  Contact Status: Patient Contact Status: Complete  Language assistant needed:          Follow-Up Questions: Do You Have Any Concerns About Your Health As You Heal From Delivery?: No Do You Have Any Concerns About Your Infants Health?: Yes What Concerns Do You Have About Your Baby?: Patient states that baby has spit up a few times. Patient reports that the spit up was breastmilk. Baby is breastfeeding. RN reviewed burping baby between breasts and after the feeding. Patient does not describe any projectile vomiting when decribing spit up. RN told patient to reach out to her pediatrician if she continues to have concerns with spitting up.  Patient has no other questions or concerns.  Edinburgh Postnatal Depression Scale:  In the Past 7 Days: I have been able to laugh and see the funny side of things.: As much as I always could I have looked forward with enjoyment to things.: As much as I ever did I have blamed myself unnecessarily when things went wrong.: No, never I have been anxious or worried for no good reason.: No, not at all I have felt scared or panicky for no good reason.: No, not at all Things have been getting on top of me.: No, I have been coping as well as ever I have been so unhappy that I have had difficulty sleeping.: Not at all I have felt sad or miserable.: No, not at all I have been so unhappy that I have been crying.: No, never The thought of harming myself has occurred to me.: Never Inocente Salles Postnatal Depression Scale Total: 0  PHQ2-9 Depression Scale:     Discharge Follow-up: Edinburgh score requires follow up?: No Patient was advised of the following resources:: Breastfeeding Support Group, Support Group  Post-discharge interventions: Reviewed Newborn Safe Sleep Practices  Signature  Signe Colt

## 2023-11-06 ENCOUNTER — Ambulatory Visit
Admission: RE | Admit: 2023-11-06 | Discharge: 2023-11-06 | Disposition: A | Discharge status: Home / Self Care | Source: Ambulatory Visit | Attending: Internal Medicine | Admitting: Internal Medicine

## 2023-11-06 VITALS — BP 104/68 | HR 100 | Temp 98.1°F | Resp 16

## 2023-11-06 DIAGNOSIS — G5601 Carpal tunnel syndrome, right upper limb: Secondary | ICD-10-CM | POA: Diagnosis not present

## 2023-11-06 DIAGNOSIS — M25531 Pain in right wrist: Secondary | ICD-10-CM | POA: Diagnosis not present

## 2023-11-06 DIAGNOSIS — G5611 Other lesions of median nerve, right upper limb: Secondary | ICD-10-CM | POA: Diagnosis not present

## 2023-11-06 MED ORDER — NAPROXEN 500 MG PO TABS
500.0000 mg | ORAL_TABLET | Freq: Two times a day (BID) | ORAL | 0 refills | Status: DC
Start: 2023-11-06 — End: 2024-04-30

## 2023-11-06 NOTE — ED Provider Notes (Addendum)
 [facility]    CSN: [id number] Arrival date & time: 11/06/23  1201      History   Chief Complaint Chief Complaint  Patient presents with   Hand Problem    My wrist is hurting I can't apply any pressure on it , no injury accrued so I'm just a little confused on why I'm experiencing pain - Entered by patient    HPI Tina Davila is a 28 y.o. female.   Tina Davila is a 28 y.o. female presenting for chief complaint of pain to the right wrist that started yesterday while she was at work. 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 Pain starts to both sides of the distal right wrist and intermittently radiates in a shooting sensation proximally up the forearm. Pain was significantly worsen when she woke up this morning after sleeping last night. Pain is also triggered by certain movements including wiping tables and ulnar/radial deviation. Denies past/recent injuries to the right wrist, numbness and tingling to the right hand. No history of carpal tunnel syndrome or tendinitis of right wrist.  She has not attempted use of any OTC medications PTA.      Past Medical History:  Diagnosis Date   Medical history non-contributory     Patient Active Problem List   Diagnosis Date Noted   Encounter for induction of labor 09/01/2023   IDA (iron deficiency anemia) 09/01/2023   Normal labor and delivery 08/14/2021   SVD (spontaneous vaginal delivery) 04/10/2020   Normal postpartum course 04/10/2020   Normal labor 04/09/2020   Rubella non-immune status, antepartum 04/09/2020   Anxiety 04/09/2020    Past Surgical History:  Procedure Laterality Date   NO PAST SURGERIES      OB History     Gravida  3   Para  3   Term  3   Preterm      AB      Living  3      SAB      IAB      Ectopic      Multiple  0   Live Births  3            Home Medications    Prior to Admission medications   Medication Sig Start Date End Date  Taking? Authorizing Provider  naproxen (NAPROSYN) 500 MG tablet Take 1 tablet (500 mg total) by mouth 2 (two) times daily. 11/06/23  Yes Starlene Eaton, FNP  acetaminophen  (TYLENOL ) 325 MG tablet Take 2 tablets (650 mg total) by mouth every 6 (six) hours as needed for moderate pain (pain score 4-6), fever or mild pain (pain score 1-3) (for pain scale < 4). 09/02/23   Grice, Vivian B, CNM  ibuprofen  (ADVIL ) 600 MG tablet Take 1 tablet (600 mg total) by mouth every 6 (six) hours. 09/02/23   Grice, Vivian B, CNM    Family History History reviewed. No pertinent family history.  Social History Social History   Tobacco Use   Smoking status: Never   Smokeless tobacco: Never  Vaping Use   Vaping status: Some Days  Substance Use Topics   Alcohol use: Never   Drug use: Never     Allergies   Patient has no known allergies.   Review of Systems Review of Systems Per HPI  Physical Exam Triage Vital Signs ED Triage Vitals  Encounter Vitals Group     BP 11/06/23 1205 104/68     Systolic  BP Percentile --      Diastolic BP Percentile --      Pulse Rate 11/06/23 1205 100     Resp 11/06/23 1205 16     Temp 11/06/23 1205 98.1 F (36.7 C)     Temp Source 11/06/23 1205 Oral     SpO2 11/06/23 1205 98 %     Weight --      Height --      Head Circumference --      Peak Flow --      Pain Score 11/06/23 1209 3     Pain Loc --      Pain Education --      Exclude from Growth Chart --    No data found.  Updated Vital Signs BP 104/68 (BP Location: Right Arm)   Pulse 100   Temp 98.1 F (36.7 C) (Oral)   Resp 16   LMP 10/10/2023   SpO2 98%   Breastfeeding No   Visual Acuity Right Eye Distance:   Left Eye Distance:   Bilateral Distance:    Right Eye Near:   Left Eye Near:    Bilateral Near:     Physical Exam Vitals and nursing note reviewed.  Constitutional:      Appearance: She is not ill-appearing or toxic-appearing.  HENT:     Head: Normocephalic and atraumatic.      Right Ear: Hearing and external ear normal.     Left Ear: Hearing and external ear normal.     Nose: Nose normal.     Mouth/Throat:     Lips: Pink.  Eyes:     General: Lids are normal. Vision grossly intact. Gaze aligned appropriately.     Extraocular Movements: Extraocular movements intact.     Conjunctiva/sclera: Conjunctivae normal.  Pulmonary:     Effort: Pulmonary effort is normal.  Musculoskeletal:     Right forearm: Normal.     Left forearm: Normal.     Right wrist: Tenderness (Tenderness elicited with radial and ulnar deviation of the right wrist) present. No swelling, deformity, effusion, lacerations, bony tenderness, snuff box tenderness or crepitus. Normal range of motion. Normal pulse.     Left wrist: Normal.     Right hand: Normal.     Left hand: Normal.     Cervical back: Neck supple.     Comments: Positive right Phalens.  Negative right Finkelstein's. 5/5 grip strength bilaterally.  Sensation intact to distal bilateral upper extremities.  +2 bilateral radial pulses.  Skin:    General: Skin is warm and dry.     Capillary Refill: Capillary refill takes less than 2 seconds.     Findings: No rash.  Neurological:     General: No focal deficit present.     Mental Status: She is alert and oriented to person, place, and time. Mental status is at baseline.     Cranial Nerves: No dysarthria or facial asymmetry.  Psychiatric:        Mood and Affect: Mood normal.        Speech: Speech normal.        Behavior: Behavior normal.        Thought Content: Thought content normal.        Judgment: Judgment normal.      UC Treatments / Results  Labs (all labs ordered are listed, but only abnormal results are displayed) Labs Reviewed - No data to display  EKG   Radiology No results found.  Procedures Procedures (including  critical care time)  Medications Ordered in UC Medications - No data to display  Initial Impression / Assessment and Plan / UC Course  I have  reviewed the triage vital signs and the nursing notes.  Pertinent labs & imaging results that were available during my care of the patient were reviewed by me and considered in my medical decision making (see chart for details).   1.  Right median nerve dysfunction, carpal tunnel syndrome of right wrist, right wrist pain Presentation is consistent with likely carpal tunnel syndrome of the right wrist and median nerve dysfunction.  Low suspicion for de Quervain's tenosynovitis as she has a negative Finkelstein's test on exam. Inflammation is likely secondary to overuse.  I would like to treat this with a wrist brace to be worn daily at bedtime and during the day for the next 2 to 3 days.  Daily at nighttimeongoing for the next 2 to 3 weeks to prevent median nerve compression at nighttime.  Will treat with naproxen every 12 hours for the next 2 to 3 days on a schedule, then as needed.  Advised to take this with food to avoid stomach upset.  She may follow-up with orthopedic provider as needed for ongoing evaluation should symptoms fail to improve.  Counseled patient on potential for adverse effects with medications prescribed/recommended today, strict ER and return-to-clinic precautions discussed, patient verbalized understanding.    Final Clinical Impressions(s) / UC Diagnoses   Final diagnoses:  Median nerve dysfunction, right  Carpal tunnel syndrome of right wrist  Right wrist pain     Discharge Instructions      Wear wrist brace for the next 3-4 days to reduce inflammation. Wear brace consistently at bedtime to reduce inflammation due to carpal tunnel (compression of the nerve in the wrist).  Take naproxen every 12 hours on a schedule for the next 2-3 days, then take as needed. Do not take other NSAIDs with naproxen (ibuprofen , etc). Rest, elevate, and ice the wrist after work or if it's hurting.  Follow-up with orthpedic provider as needed for ongoing evaluation if symptoms fail  to improve.   If you develop any new or worsening symptoms or if your symptoms do not start to improve, please return here or follow-up with your primary care provider. If your symptoms are severe, please go to the emergency room.  ED Prescriptions     Medication Sig Dispense Auth. Provider   naproxen (NAPROSYN) 500 MG tablet Take 1 tablet (500 mg total) by mouth 2 (two) times daily. 30 tablet Starlene Eaton, FNP      PDMP not reviewed this encounter.   Starlene Eaton, FNP 11/06/23 1353    Starlene Eaton, FNP 11/06/23 1353

## 2023-11-06 NOTE — ED Triage Notes (Addendum)
 Pt c/o right wrist pain since yesterday. States she only has pain with certain movements. Denies injury.

## 2023-11-06 NOTE — Discharge Instructions (Addendum)
 Wear wrist brace for the next 3-4 days to reduce inflammation. Wear brace consistently at bedtime to reduce inflammation due to carpal tunnel (compression of the nerve in the wrist).  Take naproxen every 12 hours on a schedule for the next 2-3 days, then take as needed. Do not take other NSAIDs with naproxen (ibuprofen , etc). Rest, elevate, and ice the wrist after work or if it's hurting.  Follow-up with orthpedic provider as needed for ongoing evaluation if symptoms fail to improve.   If you develop any new or worsening symptoms or if your symptoms do not start to improve, please return here or follow-up with your primary care provider. If your symptoms are severe, please go to the emergency room.

## 2023-11-21 ENCOUNTER — Other Ambulatory Visit: Payer: Self-pay

## 2023-11-21 ENCOUNTER — Ambulatory Visit
Admission: RE | Admit: 2023-11-21 | Discharge: 2023-11-21 | Disposition: A | Discharge status: Home / Self Care | Source: Ambulatory Visit | Attending: Physician Assistant | Admitting: Physician Assistant

## 2023-11-21 VITALS — BP 116/74 | HR 99 | Temp 100.3°F | Resp 16 | Ht <= 58 in | Wt 130.0 lb

## 2023-11-21 DIAGNOSIS — J020 Streptococcal pharyngitis: Secondary | ICD-10-CM | POA: Diagnosis not present

## 2023-11-21 DIAGNOSIS — R5081 Fever presenting with conditions classified elsewhere: Secondary | ICD-10-CM

## 2023-11-21 LAB — POCT RAPID STREP A (OFFICE): Rapid Strep A Screen: POSITIVE — AB

## 2023-11-21 MED ORDER — FLUCONAZOLE 150 MG PO TABS: 150.0000 mg | ORAL_TABLET | ORAL | 0 refills | Status: DC | PRN

## 2023-11-21 MED ORDER — AMOXICILLIN 500 MG PO CAPS: 500.0000 mg | ORAL_CAPSULE | Freq: Two times a day (BID) | ORAL | 0 refills | Status: AC

## 2023-11-21 NOTE — Discharge Instructions (Addendum)
 Your rapid Group A Strep test came back positive / your Strep culture came back positive  This indicates an active Strep Pharyngitis or strep throat infection which will need an antibiotic to resolve to prevent further complications I have sent in a prescription for Amoxicillin  500 mg to be taken by mouth twice per day for 10 days  FINISH THE ENTIRE COURSE unless you are instructed to stop or develop an allergic reaction  Stay well hydrated, I usually recommend consuming about 75 oz or more of water and hydrating beverages per day while recovering from such an infection.   You can use over the counter Ibuprofen  and Tylenol (alternating every 4 hours) as needed to assist with fever and pain/discomfort  I recommend discarding and replacing anything that you have used in your mouth in the last 72 hours prior to your symptoms - this includes your toothbrush, straws, mouth guards, etc. Unless you have a way of sanitizing them to reduce risk of reinfection   Do not share drinks or food with anyone until your antibiotic is complete   If you have further concerns or your symptoms seem like they are getting worse, please let us  know  If at any point you start to develop fevers that are not responding to medications, severe throat swelling, difficulty breathing, chest pain, palpitations, sandpaperlike rash please go to the emergency room as these could be signs of a medical emergency.

## 2023-11-21 NOTE — ED Provider Notes (Signed)
 [facility]    CSN: [id number] Arrival date & time: 11/21/23  1443      History   Chief Complaint Chief Complaint  Patient presents with   Sore Throat    I've had headaches , fevers , a sore throat, and swollen tonsils , I can barely swollen without pain my fever spiked to 103 but it's finally down today enough for me to come get my throat checked out - Entered by patient    HPI Tina Davila is a 28 y.o. female.   HPI  Pt reports that she has had fever, chills, sore throat, tonsillar swelling, ear pain for the past few days She reports tmax of 103 She reports her partner was sick with similar symptoms but he is feeling better now She has been taking Tylenol  and took Dayquil this AM    Past Medical History:  Diagnosis Date   Medical history non-contributory     Patient Active Problem List   Diagnosis Date Noted   Encounter for induction of labor 09/01/2023   IDA (iron deficiency anemia) 09/01/2023   Normal labor and delivery 08/14/2021   SVD (spontaneous vaginal delivery) 04/10/2020   Normal postpartum course 04/10/2020   Normal labor 04/09/2020   Rubella non-immune status, antepartum 04/09/2020   Anxiety 04/09/2020    Past Surgical History:  Procedure Laterality Date   NO PAST SURGERIES      OB History     Gravida  3   Para  3   Term  3   Preterm      AB      Living  3      SAB      IAB      Ectopic      Multiple  0   Live Births  3            Home Medications    Prior to Admission medications   Medication Sig Start Date End Date Taking? Authorizing Provider  amoxicillin (AMOXIL) 500 MG capsule Take 1 capsule (500 mg total) by mouth 2 (two) times daily for 10 days. 11/21/23 12/01/23 Yes Caress Reffitt E, PA-C  fluconazole (DIFLUCAN) 150 MG tablet Take 1 tablet (150 mg total) by mouth every three (3) days as needed. May repeat in 3 days if symptoms not resolved 11/21/23  Yes Luca Dyar E, PA-C  acetaminophen  (TYLENOL )  325 MG tablet Take 2 tablets (650 mg total) by mouth every 6 (six) hours as needed for moderate pain (pain score 4-6), fever or mild pain (pain score 1-3) (for pain scale < 4). 09/02/23   Grice, Vivian B, CNM  ibuprofen  (ADVIL ) 600 MG tablet Take 1 tablet (600 mg total) by mouth every 6 (six) hours. 09/02/23   Grice, Vivian B, CNM  naproxen  (NAPROSYN ) 500 MG tablet Take 1 tablet (500 mg total) by mouth 2 (two) times daily. 11/06/23   Starlene Eaton, FNP    Family History History reviewed. No pertinent family history.  Social History Social History   Tobacco Use   Smoking status: Never   Smokeless tobacco: Never  Vaping Use   Vaping status: Some Days  Substance Use Topics   Alcohol use: Never   Drug use: Never     Allergies   Patient has no known allergies.   Review of Systems Review of Systems  Constitutional:  Positive for fatigue and fever.  HENT:  Positive for ear pain and sore throat.      Physical  Exam Triage Vital Signs ED Triage Vitals  Encounter Vitals Group     BP 11/21/23 1529 116/74     Systolic BP Percentile --      Diastolic BP Percentile --      Pulse Rate 11/21/23 1529 99     Resp 11/21/23 1529 16     Temp 11/21/23 1529 100.3 F (37.9 C)     Temp Source 11/21/23 1529 Oral     SpO2 11/21/23 1529 98 %     Weight 11/21/23 1529 130 lb (59 kg)     Height 11/21/23 1529 4\' 10"  (1.473 m)     Head Circumference --      Peak Flow --      Pain Score 11/21/23 1546 6     Pain Loc --      Pain Education --      Exclude from Growth Chart --    No data found.  Updated Vital Signs BP 116/74 (BP Location: Right Arm)   Pulse 99   Temp 100.3 F (37.9 C) (Oral)   Resp 16   Ht 4\' 10"  (1.473 m)   Wt 130 lb (59 kg)   LMP 10/10/2023 (Exact Date)   SpO2 98%   Breastfeeding No   BMI 27.17 kg/m   Visual Acuity Right Eye Distance:   Left Eye Distance:   Bilateral Distance:    Right Eye Near:   Left Eye Near:    Bilateral Near:     Physical  Exam Vitals reviewed.  Constitutional:      General: She is awake.     Appearance: Normal appearance. She is well-developed and well-groomed.  HENT:     Head: Normocephalic and atraumatic.     Right Ear: Hearing, tympanic membrane and ear canal normal.     Left Ear: Hearing and ear canal normal. Tympanic membrane is bulging. Tympanic membrane is not injected, scarred, perforated, erythematous or retracted.     Mouth/Throat:     Lips: Pink.     Mouth: Mucous membranes are moist.     Pharynx: Oropharynx is clear. Uvula midline. Posterior oropharyngeal erythema present. No pharyngeal swelling, oropharyngeal exudate, uvula swelling or postnasal drip.     Tonsils: No tonsillar exudate. 0 on the right. 0 on the left.  Cardiovascular:     Rate and Rhythm: Normal rate and regular rhythm.     Pulses: Normal pulses.          Radial pulses are 2+ on the right side and 2+ on the left side.     Heart sounds: Normal heart sounds. No murmur heard.    No friction rub. No gallop.  Pulmonary:     Effort: Pulmonary effort is normal.     Breath sounds: Normal breath sounds. No decreased air movement. No decreased breath sounds, wheezing, rhonchi or rales.  Musculoskeletal:     Cervical back: Normal range of motion and neck supple.  Lymphadenopathy:     Head:     Right side of head: No submental, submandibular or preauricular adenopathy.     Left side of head: No submental, submandibular or preauricular adenopathy.     Cervical:     Right cervical: No superficial cervical adenopathy.    Left cervical: No superficial cervical adenopathy.     Upper Body:     Right upper body: No supraclavicular adenopathy.     Left upper body: No supraclavicular adenopathy.  Skin:    General: Skin is warm and dry.  Neurological:     Mental Status: She is alert.  Psychiatric:        Mood and Affect: Mood normal.        Behavior: Behavior normal. Behavior is cooperative.      UC Treatments / Results  Labs (all  labs ordered are listed, but only abnormal results are displayed) Labs Reviewed  POCT RAPID STREP A (OFFICE) - Abnormal; Notable for the following components:      Result Value   Rapid Strep A Screen Positive (*)    All other components within normal limits    EKG   Radiology No results found.  Procedures Procedures (including critical care time)  Medications Ordered in UC Medications - No data to display  Initial Impression / Assessment and Plan / UC Course  I have reviewed the triage vital signs and the nursing notes.  Pertinent labs & imaging results that were available during my care of the patient were reviewed by me and considered in my medical decision making (see chart for details).      Final Clinical Impressions(s) / UC Diagnoses   Final diagnoses:  Streptococcal sore throat  Fever in other diseases   Patient presents today with concerns of sore throat, fever, chills that has been ongoing and not improving with home measures.  Physical exam reveals mild pharyngeal erythema and rapid strep is positive.  Results were discussed with patient during appointment.  Will send amoxicillin 500 mg p.o. twice daily x 10 days for management.  Recommend continued use of over-the-counter medications as needed for further symptomatic relief.  ED and return precautions reviewed and provided in after visit summary.  Follow-up as needed    Discharge Instructions      Your rapid Group A Strep test came back positive / your Strep culture came back positive  This indicates an active Strep Pharyngitis or strep throat infection which will need an antibiotic to resolve to prevent further complications I have sent in a prescription for Amoxicillin 500 mg to be taken by mouth twice per day for 10 days  FINISH THE ENTIRE COURSE unless you are instructed to stop or develop an allergic reaction  Stay well hydrated, I usually recommend consuming about 75 oz or more of water and hydrating  beverages per day while recovering from such an infection.   You can use over the counter Ibuprofen  and Tylenol  (alternating every 4 hours) as needed to assist with fever and pain/discomfort  I recommend discarding and replacing anything that you have used in your mouth in the last 72 hours prior to your symptoms - this includes your toothbrush, straws, mouth guards, etc. Unless you have a way of sanitizing them to reduce risk of reinfection   Do not share drinks or food with anyone until your antibiotic is complete   If you have further concerns or your symptoms seem like they are getting worse, please let us  know  If at any point you start to develop fevers that are not responding to medications, severe throat swelling, difficulty breathing, chest pain, palpitations, sandpaperlike rash please go to the emergency room as these could be signs of a medical emergency.    ED Prescriptions     Medication Sig Dispense Auth. Provider   amoxicillin (AMOXIL) 500 MG capsule Take 1 capsule (500 mg total) by mouth 2 (two) times daily for 10 days. 20 capsule Mekiah Cambridge E, PA-C   fluconazole (DIFLUCAN) 150 MG tablet Take 1 tablet (150 mg total) by  mouth every three (3) days as needed. May repeat in 3 days if symptoms not resolved 2 tablet Efren Kross E, PA-C      PDMP not reviewed this encounter.   Jerona Mooring, PA-C 11/21/23 1734

## 2023-11-21 NOTE — ED Triage Notes (Addendum)
 Pt presents with complaints of sore throat, swollen tonsils, headaches, fevers, and bilateral ear ache x 3 days. Pt currently rates her overall throat pain a 6/10. OTC Dayquil taken this morning with some improvement. Tylenol  also taken for symptoms, last dose was 0400 today. Partner is ill as well. Fever of 103 F reported at home.

## 2023-12-15 ENCOUNTER — Ambulatory Visit
Admission: RE | Admit: 2023-12-15 | Discharge: 2023-12-15 | Disposition: A | Discharge status: Home / Self Care | Source: Ambulatory Visit | Attending: Nurse Practitioner | Admitting: Nurse Practitioner

## 2023-12-15 VITALS — BP 96/66 | HR 103 | Temp 99.1°F | Resp 18

## 2023-12-15 DIAGNOSIS — R509 Fever, unspecified: Secondary | ICD-10-CM | POA: Diagnosis present

## 2023-12-15 DIAGNOSIS — J029 Acute pharyngitis, unspecified: Secondary | ICD-10-CM | POA: Insufficient documentation

## 2023-12-15 LAB — POCT RAPID STREP A (OFFICE): Rapid Strep A Screen: NEGATIVE

## 2023-12-15 NOTE — ED Triage Notes (Signed)
 Pt present with c/o possible Strep Throat. Pt states she recently had strep, she took abx and still has a sore throat. She states she also has headaches and this morning she woke up with chills.

## 2023-12-15 NOTE — ED Provider Notes (Signed)
 [facility]    CSN: [id number] Arrival date & time: 12/15/23  1347      History   Chief Complaint Chief Complaint  Patient presents with   Sore Throat    I came in to a different urgent care a few weeks ago with strep throat and I feel like it's back again - Entered by patient    HPI Tina Davila is a 28 y.o. female.   Discussed the use of AI scribe software for clinical note transcription with the patient, who gave verbal consent to proceed.   Patient presents with recurrent sore throat and associated symptoms, having recently recovered from a confirmed case of strep throat approximately three weeks ago. The patient reports that her current symptoms began yesterday. She describes a sore throat, particularly noticeable when swallowing, though she notes it is not as severe as her previous episode. Associated symptoms include headache and chills, but she denies fever based on her own temperature checks at home. The patient also mentions that during her previous strep infection, she experienced pain in the back of her ears and significantly swollen tonsils, which are not present in this current episode. The patient's previous strep throat infection was diagnosed on June 9th. She completed a 10-day course of amoxicillin  and changed her toothbrush as instructed. She reports that she recovered fully from that episode before the onset of her current symptoms. The patient denies any known exposure to individuals with strep throat, unlike her previous infection which she believes she contracted from her fianc. She also denies runny nose, congestion, nausea, vomiting, and cough.  The following portions of the patient's history were reviewed and updated as appropriate: allergies, current medications, past family history, past medical history, past social history, past surgical history, and problem list.    Past Medical History:  Diagnosis Date   Medical history non-contributory      Patient Active Problem List   Diagnosis Date Noted   Encounter for induction of labor 09/01/2023   IDA (iron deficiency anemia) 09/01/2023   Normal labor and delivery 08/14/2021   SVD (spontaneous vaginal delivery) 04/10/2020   Normal postpartum course 04/10/2020   Normal labor 04/09/2020   Rubella non-immune status, antepartum 04/09/2020   Anxiety 04/09/2020    Past Surgical History:  Procedure Laterality Date   NO PAST SURGERIES      OB History     Gravida  3   Para  3   Term  3   Preterm      AB      Living  3      SAB      IAB      Ectopic      Multiple  0   Live Births  3            Home Medications    Prior to Admission medications   Medication Sig Start Date End Date Taking? Authorizing Provider  acetaminophen  (TYLENOL ) 325 MG tablet Take 2 tablets (650 mg total) by mouth every 6 (six) hours as needed for moderate pain (pain score 4-6), fever or mild pain (pain score 1-3) (for pain scale < 4). 09/02/23   Grice, Vivian B, CNM  fluconazole  (DIFLUCAN ) 150 MG tablet Take 1 tablet (150 mg total) by mouth every three (3) days as needed. May repeat in 3 days if symptoms not resolved 11/21/23   Mecum, Erin E, PA-C  ibuprofen  (ADVIL ) 600 MG tablet Take 1 tablet (600 mg total) by mouth  every 6 (six) hours. 09/02/23   Grice, Vivian B, CNM  naproxen  (NAPROSYN ) 500 MG tablet Take 1 tablet (500 mg total) by mouth 2 (two) times daily. 11/06/23   Enedelia Dorna HERO, FNP    Family History History reviewed. No pertinent family history.  Social History Social History   Tobacco Use   Smoking status: Never   Smokeless tobacco: Never  Vaping Use   Vaping status: Some Days  Substance Use Topics   Alcohol use: Never   Drug use: Never     Allergies   Patient has no known allergies.   Review of Systems Review of Systems  Constitutional:  Positive for chills. Negative for fever.  HENT:  Positive for sore throat. Negative for congestion and  rhinorrhea.   Respiratory:  Negative for cough.   Gastrointestinal:  Negative for nausea and vomiting.  Neurological:  Positive for headaches.  All other systems reviewed and are negative.    Physical Exam Triage Vital Signs ED Triage Vitals  Encounter Vitals Group     BP      Girls Systolic BP Percentile      Girls Diastolic BP Percentile      Boys Systolic BP Percentile      Boys Diastolic BP Percentile      Pulse      Resp      Temp      Temp src      SpO2      Weight      Height      Head Circumference      Peak Flow      Pain Score      Pain Loc      Pain Education      Exclude from Growth Chart    No data found.  Updated Vital Signs BP 96/66 (BP Location: Left Arm)   Pulse (!) 103   Temp 99.1 F (37.3 C) (Oral)   Resp 18   LMP 10/10/2023 (Exact Date)   SpO2 96%   Breastfeeding No   Visual Acuity Right Eye Distance:   Left Eye Distance:   Bilateral Distance:    Right Eye Near:   Left Eye Near:    Bilateral Near:     Physical Exam Vitals reviewed.  Constitutional:      General: She is not in acute distress.    Appearance: Normal appearance. She is well-developed and normal weight. She is not ill-appearing, toxic-appearing or diaphoretic.  HENT:     Head: Normocephalic.     Nose: Nose normal.     Mouth/Throat:     Lips: Pink.     Mouth: Mucous membranes are moist.     Pharynx: Uvula midline. Posterior oropharyngeal erythema present. No pharyngeal swelling or uvula swelling.     Tonsils: No tonsillar exudate or tonsillar abscesses.  Cardiovascular:     Rate and Rhythm: Normal rate.  Pulmonary:     Effort: Pulmonary effort is normal. No tachypnea.     Breath sounds: Normal breath sounds and air entry. No decreased air movement. No decreased breath sounds.  Musculoskeletal:        General: Normal range of motion.     Cervical back: Full passive range of motion without pain, normal range of motion and neck supple.  Skin:    General: Skin is  warm and dry.  Neurological:     General: No focal deficit present.     Mental Status: She is alert and oriented to  person, place, and time.  Psychiatric:        Mood and Affect: Mood normal.        Behavior: Behavior normal. Behavior is cooperative.      UC Treatments / Results  Labs (all labs ordered are listed, but only abnormal results are displayed) Labs Reviewed  CULTURE, GROUP A STREP [facility])  POCT RAPID STREP A (OFFICE)    EKG   Radiology No results found.  Procedures Procedures (including critical care time)  Medications Ordered in UC Medications - No data to display  Initial Impression / Assessment and Plan / UC Course  I have reviewed the triage vital signs and the nursing notes.  Pertinent labs & imaging results that were available during my care of the patient were reviewed by me and considered in my medical decision making (see chart for details).     Patient presents with recurrent sore throat symptoms following a prior episode of strep throat diagnosed on June 9th, 2025. Current symptoms began yesterday and include sore throat, headache, and chills without a measured fever. Symptoms are reportedly milder than the previous episode. Rapid strep test today is negative. Physical exam shows mild throat erythema and swelling with slightly enlarged lymph nodes. A low-grade fever of 99.7F was noted. Differential includes viral pharyngitis, bacterial pharyngitis pending culture, and allergic pharyngitis. A throat culture was sent for further evaluation. Symptomatic treatment was recommended, including warm salt water gargles, Tylenol  or ibuprofen  as needed for pain or fever, increased fluid intake, and rest. If the throat culture returns positive for strep, antibiotics will be prescribed. Patient was advised to monitor for worsening symptoms or persistence beyond a few days and to follow up if needed. Culture results will be available on MyChart. ED precautions were  reviewed.  Today's evaluation has revealed no signs of a dangerous process. Discussed diagnosis with patient and/or guardian. Patient and/or guardian aware of their diagnosis, possible red flag symptoms to watch out for and need for close follow up. Patient and/or guardian understands verbal and written discharge instructions. Patient and/or guardian comfortable with plan and disposition.  Patient and/or guardian has a clear mental status at this time, good insight into illness (after discussion and teaching) and has clear judgment to make decisions regarding their care  Documentation was completed with the aid of voice recognition software. Transcription may contain typographical errors. Final Clinical Impressions(s) / UC Diagnoses   Final diagnoses:  Acute pharyngitis, unspecified etiology  Low grade fever     Discharge Instructions      Your symptoms are most likely due to viral pharyngitis, which means inflammation of the throat caused by a virus. This is a very common reason for a sore throat. While pharyngitis can sometimes be caused by bacteria such as strep, your test today was negative, which means your symptoms are not from strep throat. Because this is a viral infection, antibiotics are not needed, as they only work against bacterial infections. We will send throat culture for further evaluation. If culture is positive for strep, you will be notified and prescribed antibiotics. You will only be notified for positive results. You may check your MyChart to review your culture results.   To help with throat discomfort, you can sip warm liquids like broth, tea, or warm water. Cold or frozen drinks and snacks, like ice pops, can also soothe your throat. Gargling with warm salt water three to four times a day may provide relief, and sucking on hard candy or throat lozenges  can help as well. Using a cool-mist humidifier at night can keep the air moist and make breathing easier. Tylenol  or  ibuprofen  can be taken if you have any pain or fever.  Be sure to drink plenty of fluids to stay well hydrated. Once you have finished your medication and your symptoms have improved, remember to replace your toothbrush to help prevent re-infection. If your symptoms get worse or do not improve within a few days, follow up with your healthcare provider.       ED Prescriptions   None    PDMP not reviewed this encounter.   Iola Lukes, [place] 12/15/23 2008

## 2023-12-15 NOTE — Discharge Instructions (Addendum)
 Your symptoms are most likely due to viral pharyngitis, which means inflammation of the throat caused by a virus. This is a very common reason for a sore throat. While pharyngitis can sometimes be caused by bacteria such as strep, your test today was negative, which means your symptoms are not from strep throat. Because this is a viral infection, antibiotics are not needed, as they only work against bacterial infections. We will send throat culture for further evaluation. If culture is positive for strep, you will be notified and prescribed antibiotics. You will only be notified for positive results. You may check your MyChart to review your culture results.   To help with throat discomfort, you can sip warm liquids like broth, tea, or warm water. Cold or frozen drinks and snacks, like ice pops, can also soothe your throat. Gargling with warm salt water three to four times a day may provide relief, and sucking on hard candy or throat lozenges can help as well. Using a cool-mist humidifier at night can keep the air moist and make breathing easier. Tylenol  or ibuprofen  can be taken if you have any pain or fever.  Be sure to drink plenty of fluids to stay well hydrated. Once you have finished your medication and your symptoms have improved, remember to replace your toothbrush to help prevent re-infection. If your symptoms get worse or do not improve within a few days, follow up with your healthcare provider.

## 2023-12-18 LAB — CULTURE, GROUP A STREP (THRC)

## 2023-12-19 ENCOUNTER — Ambulatory Visit (HOSPITAL_COMMUNITY): Payer: Self-pay

## 2023-12-29 IMAGING — DX DG CHEST 2V
2 series · 2 of 2 positions shown · non-contrast
Comparison: 12/04/2020.

CLINICAL DATA: Chest pain, upper abdominal pain, nausea. Recently
postpartum.

EXAM:
CHEST - 2 VIEW

[chest pa]
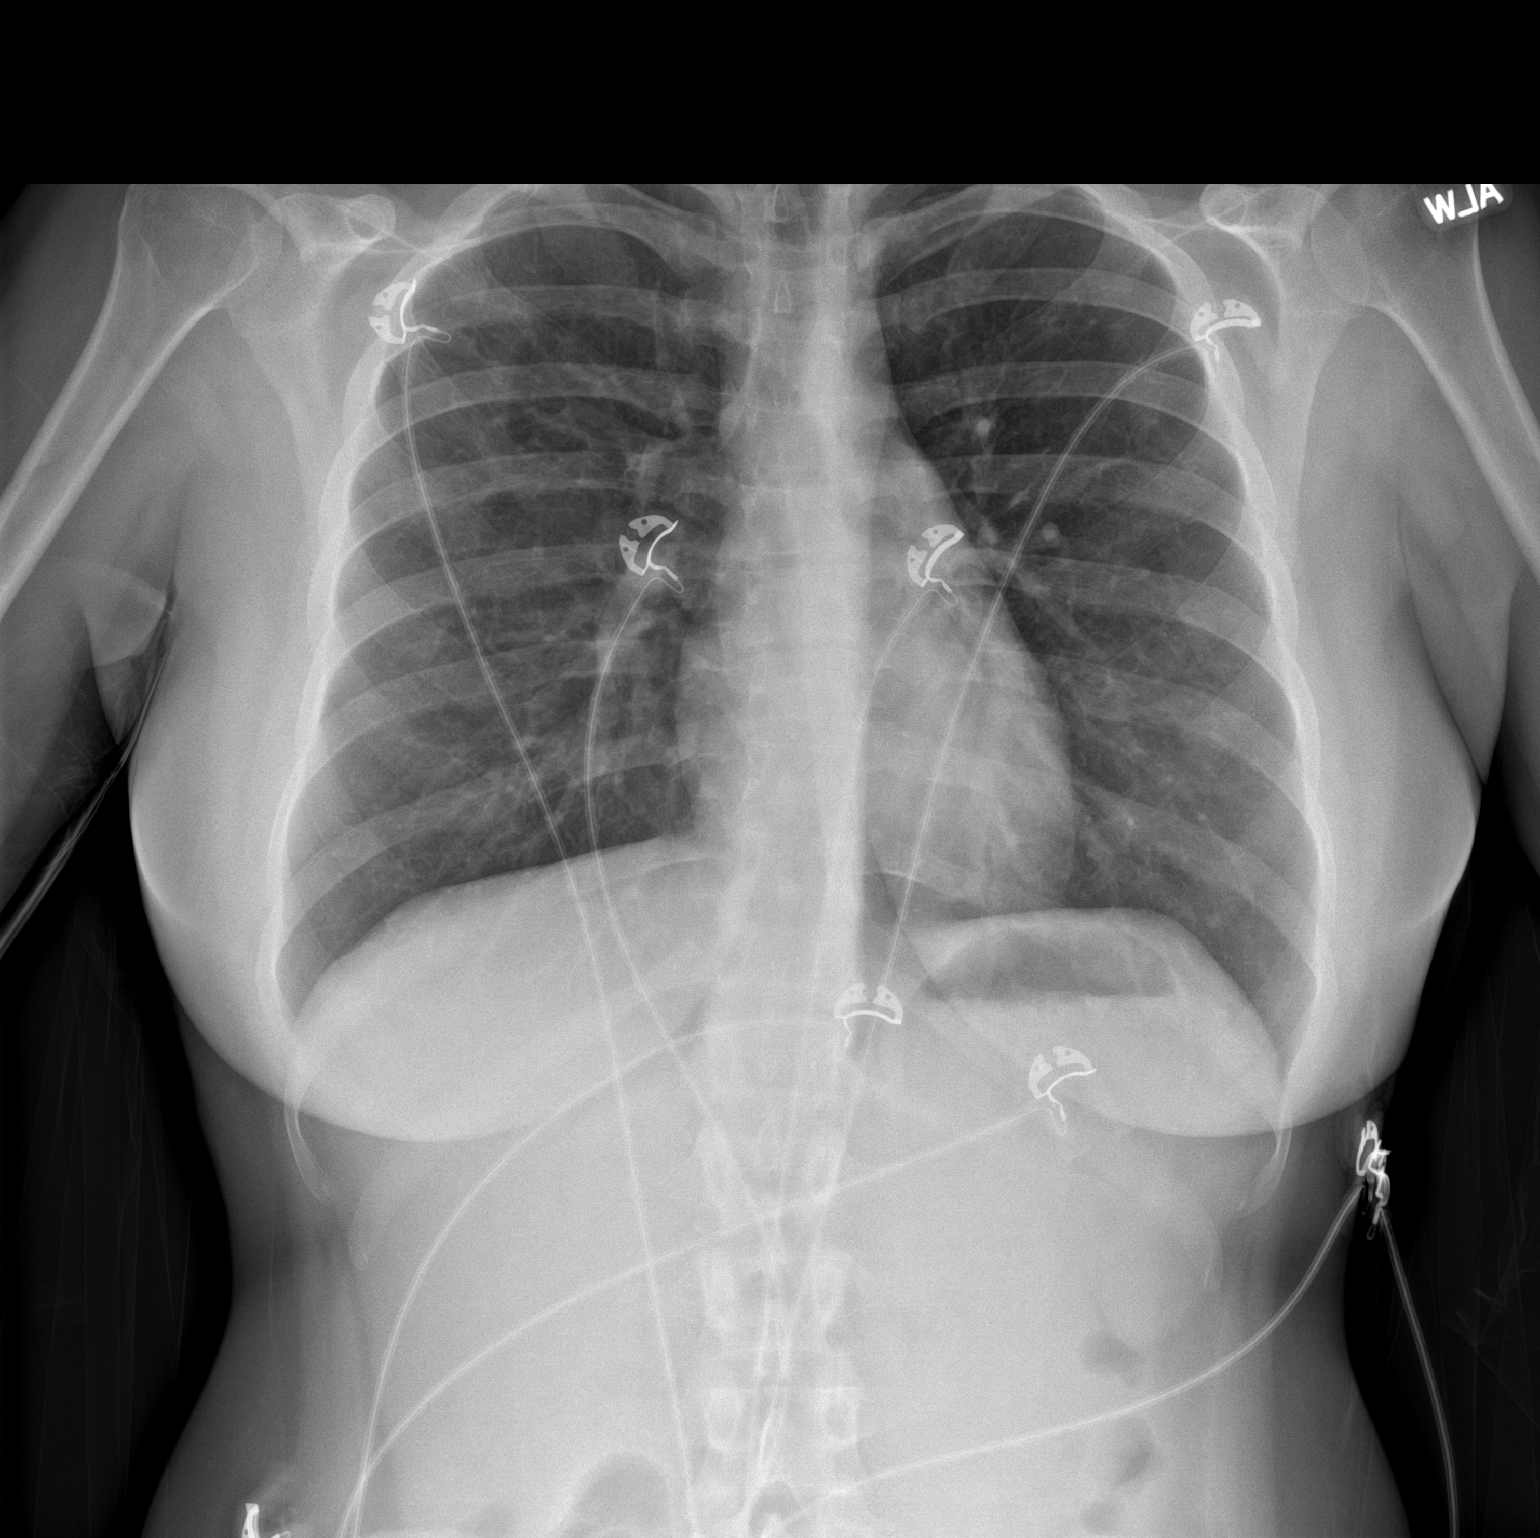

[chest lat]
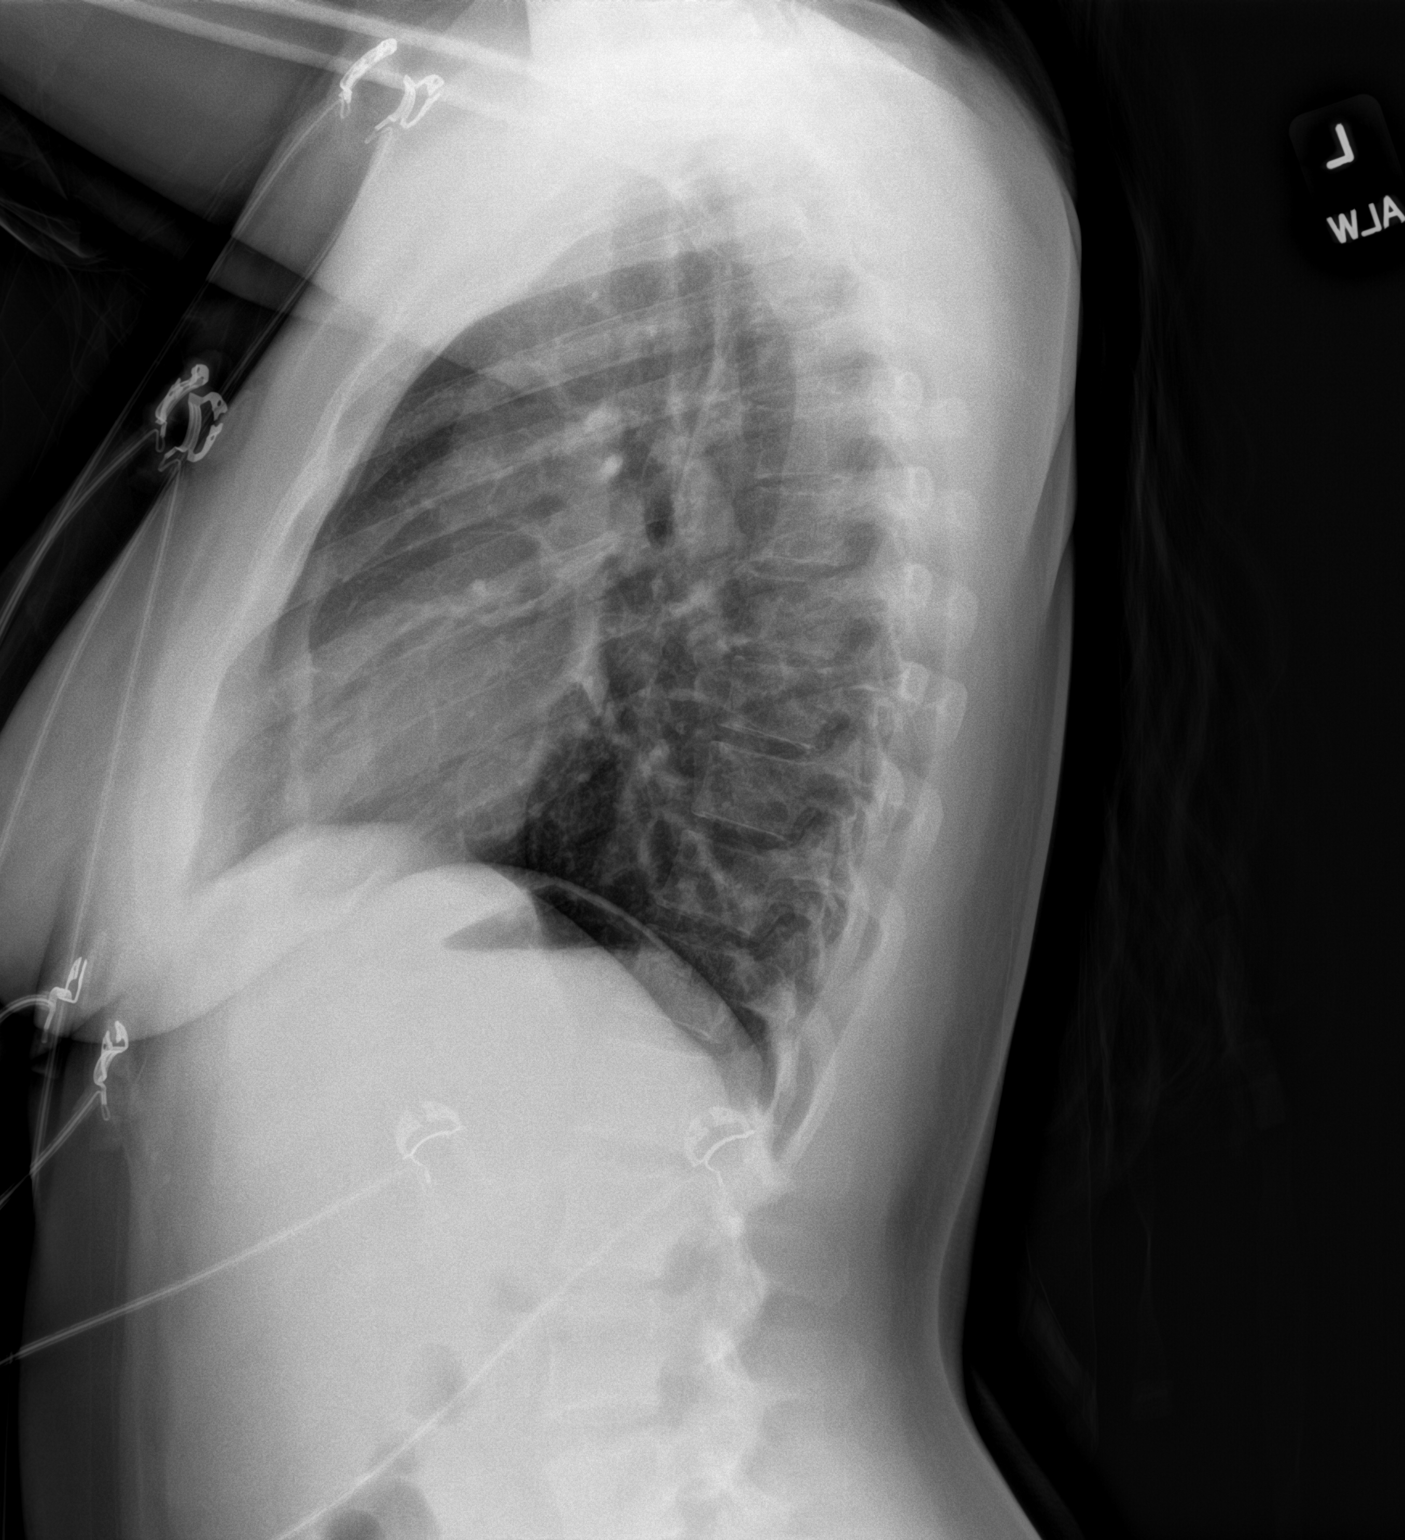

[2 of 2 positions shown; findings below may reference images not displayed]

FINDINGS: Trachea is midline. Heart size normal. Lungs are clear. No pleural
fluid. Visualized upper abdomen is unremarkable.
IMPRESSION: Negative.

## 2024-04-27 NOTE — Telephone Encounter (Signed)
 called pt to confirm apt,unable to reach

## 2024-04-29 ENCOUNTER — Inpatient Hospital Stay (HOSPITAL_COMMUNITY)

## 2024-04-29 ENCOUNTER — Inpatient Hospital Stay (HOSPITAL_COMMUNITY)
Admission: AD | Admit: 2024-04-29 | Discharge: 2024-04-30 | Disposition: A | Discharge status: Home / Self Care | Attending: Family Medicine | Admitting: Family Medicine

## 2024-04-29 ENCOUNTER — Encounter (HOSPITAL_COMMUNITY): Payer: Self-pay | Admitting: Obstetrics and Gynecology

## 2024-04-29 DIAGNOSIS — R1030 Lower abdominal pain, unspecified: Secondary | ICD-10-CM | POA: Diagnosis present

## 2024-04-29 DIAGNOSIS — O3680X0 Pregnancy with inconclusive fetal viability, not applicable or unspecified: Secondary | ICD-10-CM | POA: Diagnosis not present

## 2024-04-29 DIAGNOSIS — Z3201 Encounter for pregnancy test, result positive: Secondary | ICD-10-CM | POA: Diagnosis present

## 2024-04-29 DIAGNOSIS — O209 Hemorrhage in early pregnancy, unspecified: Secondary | ICD-10-CM | POA: Diagnosis not present

## 2024-04-29 DIAGNOSIS — Z3A01 Less than 8 weeks gestation of pregnancy: Secondary | ICD-10-CM | POA: Insufficient documentation

## 2024-04-29 DIAGNOSIS — Z113 Encounter for screening for infections with a predominantly sexual mode of transmission: Secondary | ICD-10-CM | POA: Diagnosis present

## 2024-04-29 LAB — WET PREP, GENITAL
Clue Cells Wet Prep HPF POC: NONE SEEN
Sperm: NONE SEEN
Trich, Wet Prep: NONE SEEN
WBC, Wet Prep HPF POC: <10
Yeast Wet Prep HPF POC: NONE SEEN

## 2024-04-29 LAB — CBC
HCT: 33.8 % — ABNORMAL LOW (ref 36.0–46.0)
Hemoglobin: 10.6 g/dL — ABNORMAL LOW (ref 12.0–15.0)
MCH: 23.1 pg — ABNORMAL LOW (ref 26.0–34.0)
MCHC: 31.4 g/dL (ref 30.0–36.0)
MCV: 73.8 fL — ABNORMAL LOW (ref 80.0–100.0)
Platelets: 418 K/uL — ABNORMAL HIGH (ref 150–400)
RBC: 4.58 MIL/uL (ref 3.87–5.11)
RDW: 15.6 % — ABNORMAL HIGH (ref 11.5–15.5)
WBC: 10.6 K/uL — ABNORMAL HIGH (ref 4.0–10.5)
nRBC: 0 % (ref 0.0–0.2)

## 2024-04-29 LAB — ABO/RH: ABO/RH(D): B POS

## 2024-04-29 LAB — POCT PREGNANCY, URINE: Preg Test, Ur: POSITIVE — AB

## 2024-04-29 LAB — HCG, QUANTITATIVE, PREGNANCY: hCG, Beta Chain, Quant, S: 117 m[IU]/mL — ABNORMAL HIGH (ref <5)

## 2024-04-29 NOTE — MAU Note (Signed)
 MAU Triage Note: Tina Davila is a 28 y.o. at Unknown here in [facility]U reporting: started having lower abdominal pain and back pain on Friday - went to [facility] and was informed that she is pregnant. The pain feels like her stomach is full and bloated. She feels like the pain is worse with movement - she feels sore and pressure. Also having some pressure in her lower back. She had spotting to light bleeding on Friday with one clot - denies VB since. She did not know she was pregnant and is concerned she may be having a miscarriage.   Patient complaint: Possible miscarriage  Pain Score: 4  Pain Location: Abdomen Pain Score: 4 Pain Location: Back   Onset of complaint: Friday LMP: Patient's last menstrual period was 04/09/2024.  Vitals:   04/29/24 1936  BP: 129/65  Pulse: (!) 108  Resp: 18  Temp: 98.8 F (37.1 C)  SpO2: 99%     Lab orders placed from triage: [facility] preg

## 2024-04-30 DIAGNOSIS — O209 Hemorrhage in early pregnancy, unspecified: Secondary | ICD-10-CM

## 2024-04-30 DIAGNOSIS — Z3A01 Less than 8 weeks gestation of pregnancy: Secondary | ICD-10-CM

## 2024-04-30 LAB — GC/CHLAMYDIA PROBE AMP (~~LOC~~) NOT AT ARMC
Chlamydia: NEGATIVE
Comment: NEGATIVE
Comment: NORMAL
Neisseria Gonorrhea: NEGATIVE

## 2024-04-30 MED ORDER — PREPLUS 27-1 MG PO TABS: 1.0000 | ORAL_TABLET | Freq: Every day | ORAL | 13 refills | Status: AC

## 2024-04-30 NOTE — MAU Provider Note (Signed)
 History     CSN: [id number]  Arrival date and time: 04/29/24 1903   Event Date/Time   First Provider Initiated Contact with Patient 04/30/24 [phone]      Chief Complaint  Patient presents with   Abdominal Pain   Abdominal Pain Pertinent negatives include no diarrhea, dysuria, fever, nausea or vomiting.    28 y.o. [id number] [redacted]w[redacted]d here with complaints of vaginal bleeding, occurred on Friday with minimal spotting and then started to have back cramping and lower abdominal cramping today. She went to [facility] and her pregnancy test was positive. She was taking serial pregnancy test while at home and has noticed that they were initially very positive and have become more faint over the last 5 days.  She reports that she was most concerned today and the reason she reports coming to the MAU today is because of her abdominal pain and not because of the bleeding.  She has a very sure LMP of 10/27.  She does report some irregular.  In the past year and just recently got her.  Back after her pregnancy  She is accompanied by her mother.    OB History     Gravida  4   Para  3   Term  3   Preterm      AB      Living  3      SAB      IAB      Ectopic      Multiple  0   Live Births  3           Past Medical History:  Diagnosis Date   Medical history non-contributory     Past Surgical History:  Procedure Laterality Date   NO PAST SURGERIES      No family history on file.  Social History   Tobacco Use   Smoking status: Never   Smokeless tobacco: Never  Vaping Use   Vaping status: Some Days  Substance Use Topics   Alcohol use: Never   Drug use: Never    Allergies: No Known Allergies  Medications Prior to Admission  Medication Sig Dispense Refill Last Dose/Taking   acetaminophen  (TYLENOL ) 325 MG tablet Take 2 tablets (650 mg total) by mouth every 6 (six) hours as needed for moderate pain (pain score 4-6), fever or mild pain (pain score 1-3) (for pain scale <  4).      fluconazole  (DIFLUCAN ) 150 MG tablet Take 1 tablet (150 mg total) by mouth every three (3) days as needed. May repeat in 3 days if symptoms not resolved 2 tablet 0    ibuprofen  (ADVIL ) 600 MG tablet Take 1 tablet (600 mg total) by mouth every 6 (six) hours. 30 each 0    naproxen  (NAPROSYN ) 500 MG tablet Take 1 tablet (500 mg total) by mouth 2 (two) times daily. 30 tablet 0     Review of Systems  Constitutional:  Negative for chills and fever.  HENT:  Negative for congestion and sore throat.   Eyes:  Negative for pain and visual disturbance.  Respiratory:  Negative for cough, chest tightness and shortness of breath.   Cardiovascular:  Negative for chest pain.  Gastrointestinal:  Positive for abdominal pain. Negative for diarrhea, nausea and vomiting.  Endocrine: Negative for cold intolerance and heat intolerance.  Genitourinary:  Positive for vaginal bleeding. Negative for dysuria and flank pain.  Musculoskeletal:  Negative for back pain.  Skin:  Negative for rash.  Allergic/Immunologic: Negative for  food allergies.  Neurological:  Negative for dizziness and light-headedness.  Psychiatric/Behavioral:  Negative for agitation.    Physical Exam   Blood pressure 129/65, pulse (!) 108, temperature 98.8 F (37.1 C), temperature source Oral, resp. rate 18, height 4' 10 (1.473 m), weight 66.4 kg, last menstrual period 04/09/2024, SpO2 99%, not currently breastfeeding.  Physical Exam Vitals and nursing note reviewed.  Constitutional:      Appearance: Normal appearance.  HENT:     Head: Normocephalic and atraumatic.     Nose: Nose normal.     Mouth/Throat:     Mouth: Mucous membranes are moist.  Eyes:     Conjunctiva/sclera: Conjunctivae normal.  Cardiovascular:     Rate and Rhythm: Normal rate.  Pulmonary:     Effort: Pulmonary effort is normal.  Abdominal:     General: Abdomen is flat. Bowel sounds are normal.     Palpations: Abdomen is soft.     Tenderness: There is no  abdominal tenderness.  Musculoskeletal:     Cervical back: Normal range of motion.  Skin:    General: Skin is warm.     Capillary Refill: Capillary refill takes less than 2 seconds.  Neurological:     General: No focal deficit present.     Mental Status: She is alert.  Psychiatric:        Mood and Affect: Mood normal.     MAU Course  Procedures  MDM Early pregnancy bleeding carries a differential diagnosis that includes ectopic pregnancy, early pregnancy loss, early normal variant, implantation bleeding.  Given that there was no known pregnancy located inside the uterus ectopic and miscarriage remain most likely on the differential. Results for orders placed or performed during the hospital encounter of 04/29/24 (from the past 24 hours)  Pregnancy, urine POC     Status: Abnormal   Collection Time: 04/29/24  7:46 PM  Result Value Ref Range   Preg Test, Ur POSITIVE (A) NEGATIVE  Wet prep, genital     Status: None   Collection Time: 04/29/24  7:50 PM  Result Value Ref Range   Yeast Wet Prep HPF POC NONE SEEN NONE SEEN   Trich, Wet Prep NONE SEEN NONE SEEN   Clue Cells Wet Prep HPF POC NONE SEEN NONE SEEN   WBC, Wet Prep HPF POC <10 <10   Sperm NONE SEEN   ABO/Rh     Status: None   Collection Time: 04/29/24  8:30 PM  Result Value Ref Range   ABO/RH(D) B POS    No rh immune globuloin      NOT A RH IMMUNE GLOBULIN CANDIDATE, PT RH POSITIVE Performed at [facility], [address], [place], [place] [address]   CBC     Status: Abnormal   Collection Time: 04/29/24  8:32 PM  Result Value Ref Range   WBC 10.6 (H) 4.0 - 10.5 K/uL   RBC 4.58 3.87 - 5.11 MIL/uL   Hemoglobin 10.6 (L) 12.0 - 15.0 g/dL   HCT 66.1 (L) 63.9 - 53.9 %   MCV 73.8 (L) 80.0 - 100.0 fL   MCH 23.1 (L) 26.0 - 34.0 pg   MCHC 31.4 30.0 - 36.0 g/dL   RDW 84.3 (H) 88.4 - 84.4 %   Platelets 418 (H) 150 - 400 K/uL   nRBC 0.0 0.0 - 0.2 %  hCG, quantitative, pregnancy     Status: Abnormal   Collection Time:  04/29/24  8:32 PM  Result Value Ref Range  hCG, Beta Chain, Quant, S 117 (H) <5 mIU/mL    US  OB LESS THAN 14 WEEKS WITH OB TRANSVAGINAL Result Date: 04/30/2024 CLINICAL DATA:  Pelvic pain and back pain. EXAM: OBSTETRIC <14 WK US  AND TRANSVAGINAL OB US  TECHNIQUE: Both transabdominal and transvaginal ultrasound examinations were performed for complete evaluation of the gestation as well as the maternal uterus, adnexal regions, and pelvic cul-de-sac. Transvaginal technique was performed to assess early pregnancy. COMPARISON:  None Available. FINDINGS: Intrauterine gestational sac: None Yolk sac:  Not Visualized. Embryo:  Not Visualized. Cardiac Activity: Not Visualized. Heart Rate: N/A  bpm Maternal uterus/adnexae: There is thickening of the endometrium (ultrasonographic measurements not provided). The right ovary measures 3.4 cm x 2.3 cm x 2.6 cm and is normal in appearance. The left ovary measures 3.0 cm x 1.5 cm x 2.1 cm and is normal in appearance. A trace amount of pelvic free fluid is noted. IMPRESSION: Thickened endometrium without evidence of an intrauterine pregnancy. While this may be related to the current phase of the patient's menstrual cycle, correlation with follow-up pelvic ultrasound and serial beta HCG levels is recommended. Electronically Signed   By: Suzen Dials M.D.   On: 04/30/2024 00:03   Updated patient and family members in family room.  Reviewed need for beta-hCG in 48 hours.  Reviewed all treatment options that will likely be discussed by her outpatient OB/GYN.  Patient already has appointment scheduled for 11/17  Assessment and Plan   1. Bleeding in early pregnancy   2. Pregnancy of unknown anatomic location     - Discharged home stable condition - Follow up at [facility] for repeat BHCG, has appt 11/17  Suzen Octave [facility] 04/30/2024, 3:07 AM

## 2024-04-30 NOTE — Discharge Instructions (Signed)
 Receiving intermittent assessment unit for early pregnancy spotting and abdominal pain.  Your workup bleeding medications are very low pregnancy hormone level at 117.  Your ultrasound did not show a pregnancy inside your uterus yet but also your pregnancy hormone is low enough that we would not expect to see a pregnancy inside your uterus.  We recommend that you get a repeat pregnancy hormone level in approximately 48 hours today if this level is rising or following.  If the level is falling significantly this is consistent with an early pregnancy loss.  If the pregnancy hormone level is rising/doubling every 48 hours this is reassuring for normally progressing pregnancy.  Please keep your visit at your [facility] office tomorrow so that you can discuss getting repeat blood work.   Return to the maternity assessment unit for severe abdominal pain, bleeding that soaks through 1 pad every hour, dizziness lightheadedness or feeling like you are going to pass out, or any other pregnancy related concern

## 2024-05-10 ENCOUNTER — Telehealth: Payer: Self-pay

## 2024-07-12 ENCOUNTER — Ambulatory Visit
Admission: RE | Admit: 2024-07-12 | Discharge: 2024-07-12 | Disposition: A | Discharge status: Home / Self Care | Source: Ambulatory Visit | Attending: Family Medicine | Admitting: Family Medicine

## 2024-07-12 ENCOUNTER — Other Ambulatory Visit: Payer: Self-pay

## 2024-07-12 VITALS — BP 110/74 | HR 94 | Temp 98.6°F | Resp 18

## 2024-07-12 DIAGNOSIS — R051 Acute cough: Secondary | ICD-10-CM | POA: Diagnosis not present

## 2024-07-12 DIAGNOSIS — J069 Acute upper respiratory infection, unspecified: Secondary | ICD-10-CM

## 2024-07-12 LAB — POC SOFIA SARS ANTIGEN FIA: SARS Coronavirus 2 Ag: NEGATIVE

## 2024-07-12 MED ORDER — ALBUTEROL SULFATE HFA 108 (90 BASE) MCG/ACT IN AERS: 1.0000 | INHALATION_SPRAY | Freq: Four times a day (QID) | RESPIRATORY_TRACT | 0 refills | Status: AC | PRN

## 2024-07-12 NOTE — Discharge Instructions (Addendum)
 You have tested negative for COVID.  You may use the albuterol  inhaler as needed for shortness of breath. Please treat your symptoms with over the counter cough medication, tylenol  or ibuprofen , humidifier, and rest. Viral illnesses can last 7-10 days. Please follow up with your PCP if your symptoms are not improving. Please go to the ER for any worsening symptoms. This includes but is not limited to fever you can not control with tylenol  or ibuprofen , you are not able to stay hydrated, you have shortness of breath or chest pain.  Thank you for choosing [facility] for your healthcare needs. I hope you feel better soon!

## 2024-07-12 NOTE — ED Provider Notes (Addendum)
 " [facility]    CSN: [id number] Arrival date & time: 07/12/24  1328      History   Chief Complaint Chief Complaint  Patient presents with   Cough   Wheezing    HPI Tina Davila is a 29 y.o. female  presents for evaluation of URI symptoms for 4 days. Patient reports associated symptoms of cough, congestion, shortness of breath with coughing, wheezing, chills. Denies N/V/D, fevers, sore throat, body aches, ear pain. Patient does not have a hx of asthma. Patient does  vape.  Reports sick contacts for children.  Reports a negative covid/flu test on day one of sx. Pt has taken Tylenol  and DayQuil OTC for symptoms. Pt has no other concerns at this time.    Cough Associated symptoms: chills, shortness of breath and wheezing   Wheezing Associated symptoms: cough and shortness of breath     Past Medical History:  Diagnosis Date   Medical history non-contributory     Patient Active Problem List   Diagnosis Date Noted   Encounter for induction of labor 09/01/2023   IDA (iron deficiency anemia) 09/01/2023   Normal labor and delivery 08/14/2021   SVD (spontaneous vaginal delivery) 04/10/2020   Normal postpartum course 04/10/2020   Normal labor 04/09/2020   Rubella non-immune status, antepartum 04/09/2020   Anxiety 04/09/2020    Past Surgical History:  Procedure Laterality Date   NO PAST SURGERIES      OB History     Gravida  4   Para  3   Term  3   Preterm      AB      Living  3      SAB      IAB      Ectopic      Multiple  0   Live Births  3            Home Medications    Prior to Admission medications  Medication Sig Start Date End Date Taking? Authorizing Provider  acetaminophen  (TYLENOL ) 325 MG tablet Take 2 tablets (650 mg total) by mouth every 6 (six) hours as needed for moderate pain (pain score 4-6), fever or mild pain (pain score 1-3) (for pain scale < 4). 09/02/23  Yes Grice, Vivian B, CNM  albuterol  (VENTOLIN  HFA)  108 (90 Base) MCG/ACT inhaler Inhale 1-2 puffs into the lungs every 6 (six) hours as needed. 07/12/24  Yes Franck Vinal, Jodi R, NP  Prenatal Vit-Fe Fumarate-FA (PREPLUS) 27-1 MG TABS Take 1 tablet by mouth daily. 04/30/24   Eldonna Suzen Octave, MD    Family History History reviewed. No pertinent family history.  Social History Social History[1]   Allergies   Patient has no known allergies.   Review of Systems Review of Systems  Constitutional:  Positive for chills.  HENT:  Positive for congestion.   Respiratory:  Positive for cough, shortness of breath and wheezing.      Physical Exam Triage Vital Signs ED Triage Vitals  Encounter Vitals Group     BP 07/12/24 1343 110/74     Girls Systolic BP Percentile --      Girls Diastolic BP Percentile --      Boys Systolic BP Percentile --      Boys Diastolic BP Percentile --      Pulse Rate 07/12/24 1343 94     Resp 07/12/24 1343 18     Temp 07/12/24 1343 98.6 F (37 C)     Temp Source  07/12/24 1343 Oral     SpO2 07/12/24 1343 96 %     Weight --      Height --      Head Circumference --      Peak Flow --      Pain Score 07/12/24 1340 0     Pain Loc --      Pain Education --      Exclude from Growth Chart --    No data found.  Updated Vital Signs BP 110/74 (BP Location: Right Arm)   Pulse 94   Temp 98.6 F (37 C) (Oral)   Resp 18   LMP 06/11/2024 (Exact Date)   SpO2 96%   Breastfeeding No   Visual Acuity Right Eye Distance:   Left Eye Distance:   Bilateral Distance:    Right Eye Near:   Left Eye Near:    Bilateral Near:     Physical Exam Vitals and nursing note reviewed.  Constitutional:      General: She is not in acute distress.    Appearance: She is well-developed. She is not ill-appearing.  HENT:     Head: Normocephalic and atraumatic.     Right Ear: Tympanic membrane and ear canal normal.     Left Ear: Tympanic membrane and ear canal normal.     Nose: Congestion present.     Mouth/Throat:      Mouth: Mucous membranes are moist.     Pharynx: Oropharynx is clear. Uvula midline. No oropharyngeal exudate or posterior oropharyngeal erythema.     Tonsils: No tonsillar exudate or tonsillar abscesses.  Eyes:     Conjunctiva/sclera: Conjunctivae normal.     Pupils: Pupils are equal, round, and reactive to light.  Cardiovascular:     Rate and Rhythm: Normal rate and regular rhythm.     Heart sounds: Normal heart sounds.  Pulmonary:     Effort: Pulmonary effort is normal. No respiratory distress.     Breath sounds: Normal breath sounds. No stridor. No wheezing, rhonchi or rales.  Musculoskeletal:     Cervical back: Normal range of motion and neck supple.  Lymphadenopathy:     Cervical: No cervical adenopathy.  Skin:    General: Skin is warm and dry.  Neurological:     General: No focal deficit present.     Mental Status: She is alert and oriented to person, place, and time.  Psychiatric:        Mood and Affect: Mood normal.        Behavior: Behavior normal.      UC Treatments / Results  Labs (all labs ordered are listed, but only abnormal results are displayed) Labs Reviewed  POC SOFIA SARS ANTIGEN FIA - Normal    EKG   Radiology No results found.  Procedures Procedures (including critical care time)  Medications Ordered in UC Medications - No data to display  Initial Impression / Assessment and Plan / UC Course  I have reviewed the triage vital signs and the nursing notes.  Pertinent labs & imaging results that were available during my care of the patient were reviewed by me and considered in my medical decision making (see chart for details).     Negative COVID testing.  Discussed viral illness and symptomatic treatment.  Albuterol  inhaler as needed.  Encouraged rest fluids and PCP follow-up if symptoms do not improve.  ER precautions reviewed. Final Clinical Impressions(s) / UC Diagnoses   Final diagnoses:  Acute cough  Viral upper respiratory  illness      Discharge Instructions      You have tested negative for COVID.  You may use the albuterol  inhaler as needed for shortness of breath. Please treat your symptoms with over the counter cough medication, tylenol  or ibuprofen , humidifier, and rest. Viral illnesses can last 7-10 days. Please follow up with your PCP if your symptoms are not improving. Please go to the ER for any worsening symptoms. This includes but is not limited to fever you can not control with tylenol  or ibuprofen , you are not able to stay hydrated, you have shortness of breath or chest pain.  Thank you for choosing [facility] for your healthcare needs. I hope you feel better soon!     ED Prescriptions     Medication Sig Dispense Auth. Provider   albuterol  (VENTOLIN  HFA) 108 (90 Base) MCG/ACT inhaler Inhale 1-2 puffs into the lungs every 6 (six) hours as needed. 1 each Loreda Myla SAUNDERS, NP      PDMP not reviewed this encounter.    Loreda Myla SAUNDERS, NP 07/12/24 1409     [1]  Social History Tobacco Use   Smoking status: Never   Smokeless tobacco: Never  Vaping Use   Vaping status: Every Day  Substance Use Topics   Alcohol use: Not Currently   Drug use: Never     Loreda Myla SAUNDERS, NP 07/12/24 1410  "

## 2024-07-12 NOTE — ED Triage Notes (Signed)
 Pt c/o deep cough with yellow phlegm and some wheezing since Monday. Patient reports trying to catch her breath when she is up and moving around. Pt reports that wheezing is worse at night and burning in chest with cough. Afebrile. Pt reports light chills. Patient took at home combo flu/covid test which were negative. Patient reports daily vaping. Patient reports that her children are sick, and one is school aged. Pt has been taking Tylenol  daily and DayQuil once. Last dose of Tylenol  around 7 am today.
